# Patient Record
Sex: Male | Born: 1965 | Race: White | Hispanic: No | Marital: Married | Smoking: Never smoker
Health system: Southern US, Community
[De-identification: ages and names within clinical notes are randomized; demographics above are authoritative.]

## PROBLEM LIST (undated history)

## (undated) DIAGNOSIS — E785 Hyperlipidemia, unspecified: Secondary | ICD-10-CM

## (undated) DIAGNOSIS — F419 Anxiety disorder, unspecified: Secondary | ICD-10-CM

## (undated) DIAGNOSIS — K649 Unspecified hemorrhoids: Secondary | ICD-10-CM

## (undated) DIAGNOSIS — J45909 Unspecified asthma, uncomplicated: Secondary | ICD-10-CM

## (undated) DIAGNOSIS — K219 Gastro-esophageal reflux disease without esophagitis: Secondary | ICD-10-CM

## (undated) DIAGNOSIS — D369 Benign neoplasm, unspecified site: Secondary | ICD-10-CM

## (undated) DIAGNOSIS — D649 Anemia, unspecified: Secondary | ICD-10-CM

## (undated) DIAGNOSIS — T7840XA Allergy, unspecified, initial encounter: Secondary | ICD-10-CM

## (undated) HISTORY — DX: Benign neoplasm, unspecified site: D36.9

## (undated) HISTORY — PX: UPPER GASTROINTESTINAL ENDOSCOPY: SHX188

## (undated) HISTORY — PX: APPENDECTOMY: SHX54

## (undated) HISTORY — DX: Unspecified asthma, uncomplicated: J45.909

## (undated) HISTORY — DX: Gastro-esophageal reflux disease without esophagitis: K21.9

## (undated) HISTORY — PX: WISDOM TOOTH EXTRACTION: SHX21

## (undated) HISTORY — DX: Allergy, unspecified, initial encounter: T78.40XA

## (undated) HISTORY — PX: COLONOSCOPY: SHX174

## (undated) HISTORY — DX: Anxiety disorder, unspecified: F41.9

## (undated) HISTORY — DX: Unspecified hemorrhoids: K64.9

## (undated) HISTORY — PX: POLYPECTOMY: SHX149

## (undated) HISTORY — DX: Anemia, unspecified: D64.9

## (undated) HISTORY — DX: Hyperlipidemia, unspecified: E78.5

---

## 1998-03-12 HISTORY — PX: NASAL SEPTUM SURGERY: SHX37

## 1999-11-21 ENCOUNTER — Emergency Department (HOSPITAL_COMMUNITY): Admission: EM | Admit: 1999-11-21 | Discharge: 1999-11-21 | Payer: Self-pay | Admitting: Emergency Medicine

## 1999-11-30 ENCOUNTER — Encounter: Payer: Self-pay | Admitting: Internal Medicine

## 1999-11-30 ENCOUNTER — Encounter: Admission: RE | Admit: 1999-11-30 | Discharge: 1999-11-30 | Payer: Self-pay | Admitting: Internal Medicine

## 2003-09-03 ENCOUNTER — Ambulatory Visit (HOSPITAL_COMMUNITY): Admission: RE | Admit: 2003-09-03 | Discharge: 2003-09-03 | Payer: Self-pay | Admitting: Gastroenterology

## 2005-05-18 ENCOUNTER — Encounter: Admission: RE | Admit: 2005-05-18 | Discharge: 2005-05-18 | Payer: Self-pay | Admitting: Family Medicine

## 2007-09-03 ENCOUNTER — Encounter: Payer: Self-pay | Admitting: Gastroenterology

## 2007-11-19 ENCOUNTER — Encounter: Payer: Self-pay | Admitting: Gastroenterology

## 2007-11-21 ENCOUNTER — Ambulatory Visit (HOSPITAL_COMMUNITY): Admission: RE | Admit: 2007-11-21 | Discharge: 2007-11-21 | Payer: Self-pay | Admitting: Gastroenterology

## 2007-12-05 ENCOUNTER — Encounter: Admission: RE | Admit: 2007-12-05 | Discharge: 2007-12-05 | Payer: Self-pay | Admitting: Gastroenterology

## 2007-12-05 ENCOUNTER — Encounter: Payer: Self-pay | Admitting: Gastroenterology

## 2007-12-29 ENCOUNTER — Encounter: Payer: Self-pay | Admitting: Gastroenterology

## 2007-12-29 DIAGNOSIS — R933 Abnormal findings on diagnostic imaging of other parts of digestive tract: Secondary | ICD-10-CM | POA: Insufficient documentation

## 2008-01-15 ENCOUNTER — Ambulatory Visit (HOSPITAL_COMMUNITY): Admission: RE | Admit: 2008-01-15 | Discharge: 2008-01-15 | Payer: Self-pay | Admitting: Gastroenterology

## 2008-01-15 ENCOUNTER — Encounter: Payer: Self-pay | Admitting: Gastroenterology

## 2008-01-15 ENCOUNTER — Ambulatory Visit: Payer: Self-pay | Admitting: Gastroenterology

## 2010-04-03 ENCOUNTER — Encounter: Payer: Self-pay | Admitting: Gastroenterology

## 2010-07-28 NOTE — Op Note (Signed)
NAME:  Dylan Stewart, Dylan Stewart                      ACCOUNT NO.:  [id number]   MEDICAL RECORD NO.:  [id number]                   PATIENT TYPE:  AMB   LOCATION:  ENDO                                 FACILITY:  [facility]   PHYSICIAN:  Danise Edge, M.D.                DATE OF BIRTH:  [birth year]   DATE OF PROCEDURE:  09/03/2003  DATE OF DISCHARGE:                                 OPERATIVE REPORT   PROCEDURE:  Esophagogastroduodenoscopy and Savary esophageal dilation.   PROCEDURE INDICATION:  Mr. Dylan Stewart is a 45 year old male, born  09-Jun-1965.  Mr. Dylan Stewart has intermittent solid food dysphagia.  A  barium swallow x-ray a few years ago revealed a shallow ring at the  esophagogastric junction.   ENDOSCOPIST:  Danise Edge, M.D.   PREMEDICATION:  1. Fentanyl 100 mcg.  2. Versed 10 mg.   DESCRIPTION OF PROCEDURE:  After obtaining informed consent, Mr. Dylan Stewart  was placed in the left lateral decubitus position.  I administered  intravenous fentanyl and intravenous Versed to achieve conscious sedation  for the procedure.  The patient's blood pressure, oxygen saturation, and  cardiac rhythm were monitored throughout the procedure and documented in the  medical record.   The Olympus gastroscope was passed through the posterior hypopharynx into  the proximal esophagus without difficulty.  The hypopharynx, larynx, and  vocal cords appeared normal.   ESOPHAGOSCOPY:  The proximal and mid and lower segments of the esophageal  mucosa appear completely normal.  Endoscopically, there is no evidence for  the presence of a Schatzki's ring, esophageal mucosal scarring, or  esophageal stricture formation.   GASTROSCOPY:  Retroflexed view of the gastric cardia and fundus was normal.  The gastric body, antrum, and pylorus appear normal.   DUODENOSCOPY:  The duodenal bulb and descending duodenum appear normal.   SAVARY ESOPHAGEAL DILATION:  The Savary dilator wire was passed through  the  endoscope and the tip of the guidewire advanced to the distal gastric  antrum, as confirmed endoscopically without using radiology.  The 15 mm  Savary dilator passed without resistance.  Repeat esophagogastroscopy  revealed no dilation of a ring or stricture at the esophagogastric junction  and no gastric trauma due to the guidewire.   ASSESSMENT:  Normal esophagogastroduodenoscopy.  A 15 mm Savary dilator  passed without resistance and without dilation of a stricture or ring at the  esophagogastric junction.   PLAN:  If Dylan Stewart's dysphagia recurs postesophageal dilation, I will  treat him for probable esophageal spasm.  I cannot rule out achalasia, but I  think it would be unlikely.                                               Danise Edge, M.D.    MJ/MEDQ  D:  09/03/2003  T:  09/03/2003  Job:  [id number]   cc:   Ike Bene, M.D.  [address]  [place]  [place] [address]  Fax: [phone]

## 2011-09-03 ENCOUNTER — Other Ambulatory Visit: Payer: Self-pay | Admitting: Gastroenterology

## 2011-09-03 DIAGNOSIS — M545 Low back pain: Secondary | ICD-10-CM

## 2011-09-10 ENCOUNTER — Other Ambulatory Visit: Payer: Self-pay

## 2012-11-29 ENCOUNTER — Encounter (HOSPITAL_COMMUNITY): Payer: Self-pay | Admitting: Emergency Medicine

## 2012-11-29 ENCOUNTER — Emergency Department (HOSPITAL_COMMUNITY)
Admission: EM | Admit: 2012-11-29 | Discharge: 2012-11-29 | Disposition: A | Discharge status: Home / Self Care | Payer: Managed Care, Other (non HMO) | Attending: Emergency Medicine | Admitting: Emergency Medicine

## 2012-11-29 ENCOUNTER — Emergency Department (HOSPITAL_COMMUNITY): Payer: Managed Care, Other (non HMO)

## 2012-11-29 DIAGNOSIS — Z79899 Other long term (current) drug therapy: Secondary | ICD-10-CM | POA: Insufficient documentation

## 2012-11-29 DIAGNOSIS — M549 Dorsalgia, unspecified: Secondary | ICD-10-CM

## 2012-11-29 DIAGNOSIS — M545 Low back pain, unspecified: Secondary | ICD-10-CM | POA: Insufficient documentation

## 2012-11-29 MED ORDER — HYDROMORPHONE HCL PF 1 MG/ML IJ SOLN
1.0000 mg | Freq: Once | INTRAMUSCULAR | Status: AC
  Administered 2012-11-29: 1 mg via INTRAVENOUS
  Filled 2012-11-29: qty 1

## 2012-11-29 MED ORDER — OXYCODONE-ACETAMINOPHEN 5-325 MG PO TABS: 2.0000 | ORAL_TABLET | ORAL | Status: DC | PRN

## 2012-11-29 MED ORDER — ONDANSETRON HCL 4 MG/2ML IJ SOLN
4.0000 mg | Freq: Once | INTRAMUSCULAR | Status: AC
  Administered 2012-11-29: 4 mg via INTRAVENOUS
  Filled 2012-11-29: qty 2

## 2012-11-29 MED ORDER — DIAZEPAM 5 MG PO TABS: 5.0000 mg | ORAL_TABLET | Freq: Four times a day (QID) | ORAL | Status: DC | PRN

## 2012-11-29 NOTE — ED Provider Notes (Signed)
CSN: [id number]     Arrival date & time 11/29/12  1625 History   First MD Initiated Contact with Patient 11/29/12 1646     Chief Complaint  Patient presents with  . Flank Pain   (Consider location/radiation/quality/duration/timing/severity/associated sxs/prior Treatment) HPI Comments: Patient's presents to the ER with complaints of right flank pain. Patient reports that the pain began 3 weeks ago. At that time it was a slight pain, dull ache. It started after doing some yard work, but he did not notice any specific injury. Patient reports that a week ago the pain significantly worsened. He saw his doctor at that time and it was felt that he might have a herniated disc, was given steroids and Vicodin, but has not had any pain relief. Patient reports that the pain is constant, moderate to severe and at times very severe. He reports that it is positional, as worsens when he sits for a long time, and bruises he is up and mobile. He does not have radiation of the pain. No numbness, weakness in the lower extremities. No change in bowel or bladder function.  Patient is a 47 y.o. male presenting with flank pain.  Flank Pain    History reviewed. No pertinent past medical history. Past Surgical History  Procedure Laterality Date  . Appendectomy    . Wisdom tooth extraction     No family history on file. History  Substance Use Topics  . Smoking status: Never Smoker   . Smokeless tobacco: Not on file  . Alcohol Use: Yes     Comment: socially    Review of Systems  Constitutional: Negative for fever.  Gastrointestinal: Negative.   Genitourinary: Positive for flank pain. Negative for dysuria and hematuria.  Neurological: Negative.   All other systems reviewed and are negative.    Allergies  Morphine and related  Home Medications   Current Outpatient Rx  Name  Route  Sig  Dispense  Refill  . acetaminophen (TYLENOL) 500 MG tablet   Oral   Take 500 mg by mouth every 6 (six) hours as  needed for pain.         . cyclobenzaprine (FLEXERIL) 5 MG tablet   Oral   Take 5 mg by mouth 3 (three) times daily as needed for muscle spasms.         . DULoxetine (CYMBALTA) 60 MG capsule   Oral   Take 60 mg by mouth daily.         Marland Kitchen esomeprazole (NEXIUM) 40 MG capsule   Oral   Take 40 mg by mouth daily before breakfast.         . HYDROcodone-acetaminophen (NORCO/VICODIN) 5-325 MG per tablet   Oral   Take 1 tablet by mouth every 6 (six) hours as needed for pain.          BP 116/72  Pulse 87  Temp(Src) 98.1 F (36.7 C) (Oral)  Resp 20  SpO2 99% Physical Exam  Constitutional: He is oriented to person, place, and time. He appears well-developed and well-nourished. No distress.  HENT:  Head: Normocephalic and atraumatic.  Right Ear: Hearing normal.  Left Ear: Hearing normal.  Nose: Nose normal.  Mouth/Throat: Oropharynx is clear and moist and mucous membranes are normal.  Eyes: Conjunctivae and EOM are normal. Pupils are equal, round, and reactive to light.  Neck: Normal range of motion. Neck supple.  Cardiovascular: Regular rhythm, S1 normal and S2 normal.  Exam reveals no gallop and no friction rub.  No murmur heard. Pulmonary/Chest: Effort normal and breath sounds normal. No respiratory distress. He exhibits no tenderness.  Abdominal: Soft. Normal appearance and bowel sounds are normal. There is no hepatosplenomegaly. There is no tenderness. There is no rebound, no guarding, no tenderness at McBurney's point and negative Murphy's sign. No hernia.  Musculoskeletal: Normal range of motion.       Lumbar back: He exhibits tenderness. He exhibits no bony tenderness.       Back:  Neurological: He is alert and oriented to person, place, and time. He has normal strength. No cranial nerve deficit or sensory deficit. Coordination normal. GCS eye subscore is 4. GCS verbal subscore is 5. GCS motor subscore is 6.  Skin: Skin is warm, dry and intact. No rash noted. No  cyanosis.  Psychiatric: He has a normal mood and affect. His speech is normal and behavior is normal. Thought content normal.    ED Course  Procedures (including critical care time) Labs Review Labs Reviewed - No data to display Imaging Review Ct Abdomen Pelvis Wo Contrast  11/29/2012   CLINICAL DATA:  Flank pain  EXAM: CT ABDOMEN AND PELVIS WITHOUT CONTRAST  TECHNIQUE: Multidetector CT imaging of the abdomen and pelvis was performed following the standard protocol without oral or intravenous contrast.  COMPARISON:  Abdominal MRI December 05, 2007  FINDINGS: There is mild scarring in the posterior right base. Lung bases are otherwise clear.  There is again noted a cystic structure just anterior to the distal esophagus measuring 1.7 x 1.4 cm. 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  No focal liver lesions or appreciable on this noncontrast enhanced study. There is no appreciable biliary duct dilatation. Gallbladder wall is not thickened.  Spleen, pancreas, and adrenals appear normal. Kidneys bilaterally show no mass, calculus, or hydronephrosis on either side. There is no ureteral calculus or ureterectasis on either side.  In the pelvis, there is no mass or fluid collection. There is fat in both inguinal rings. Appendix is absent. There is no periappendiceal region inflammation.  There is no bowel obstruction. No free air or portal venous air.  There is no ascites, adenopathy, or abscess in the abdomen or pelvis. Aorta is non aneurysmal. There are no blastic or lytic bone lesions.  IMPRESSION: There is no bowel obstruction. No abscess. No mesenteric inflammation. Appendix is absent.  No renal or ureteral calculus. No hydronephrosis.  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   Electronically Signed   By: Bretta Bang   On: 11/29/2012 18:45    MDM  Diagnosis: Severe lumbar pain  Patient presents to the ER for  evaluation of severe right flank and back pain. Symptoms do seem to be musculoskeletal in nature based on his presentation. Pain is very positional in nature. CAT scan was performed to rule out kidney stone other pathology. CAT scan shows nothing acute. Patient administered Dilaudid here in the ER. He has achieved improved pain control. Will change his outpatient medication to Percocet, followup with primary doctor Monday.    Gilda Crease, MD 11/29/12 [phone]

## 2012-11-29 NOTE — ED Notes (Signed)
Pt from home c/o L flank pain x3 weeks. Pt reports that he has seen PCP and was tx for possible ruptured disc. Pt denies dysuria, frequent urination, hematuria, N/V/D or fever. Pt adds that he has had 2 syncopal episodes in the past 3 days but none today. Pt is A&O and in NAD

## 2013-10-03 ENCOUNTER — Emergency Department (HOSPITAL_COMMUNITY)
Admission: EM | Admit: 2013-10-03 | Discharge: 2013-10-03 | Disposition: A | Discharge status: Home / Self Care | Payer: 59 | Attending: Emergency Medicine | Admitting: Emergency Medicine

## 2013-10-03 ENCOUNTER — Encounter (HOSPITAL_COMMUNITY): Payer: Self-pay | Admitting: Emergency Medicine

## 2013-10-03 DIAGNOSIS — R55 Syncope and collapse: Secondary | ICD-10-CM | POA: Diagnosis present

## 2013-10-03 DIAGNOSIS — R61 Generalized hyperhidrosis: Secondary | ICD-10-CM | POA: Diagnosis not present

## 2013-10-03 DIAGNOSIS — R51 Headache: Secondary | ICD-10-CM | POA: Insufficient documentation

## 2013-10-03 DIAGNOSIS — Z79899 Other long term (current) drug therapy: Secondary | ICD-10-CM | POA: Diagnosis not present

## 2013-10-03 DIAGNOSIS — R42 Dizziness and giddiness: Secondary | ICD-10-CM | POA: Diagnosis not present

## 2013-10-03 LAB — COMPREHENSIVE METABOLIC PANEL
ALT: 17 U/L (ref 0–53)
AST: 16 U/L (ref 0–37)
Albumin: 3.9 g/dL (ref 3.5–5.2)
Alkaline Phosphatase: 60 U/L (ref 39–117)
Anion gap: 14 (ref 5–15)
BUN: 14 mg/dL (ref 6–23)
CO2: 23 mEq/L (ref 19–32)
Calcium: 9.3 mg/dL (ref 8.4–10.5)
Chloride: 102 mEq/L (ref 96–112)
Creatinine, Ser: 1.07 mg/dL (ref 0.50–1.35)
GFR calc Af Amer: >90 mL/min (ref >90)
GFR calc non Af Amer: 80 mL/min — ABNORMAL LOW (ref >90)
Glucose, Bld: 91 mg/dL (ref 70–99)
Potassium: 4.1 mEq/L (ref 3.7–5.3)
Sodium: 139 mEq/L (ref 137–147)
Total Bilirubin: 0.5 mg/dL (ref 0.3–1.2)
Total Protein: 7.2 g/dL (ref 6.0–8.3)

## 2013-10-03 LAB — I-STAT CHEM 8, ED
BUN: 13 mg/dL (ref 6–23)
Calcium, Ion: 1.16 mmol/L (ref 1.12–1.23)
Chloride: 103 mEq/L (ref 96–112)
Creatinine, Ser: 1.2 mg/dL (ref 0.50–1.35)
Glucose, Bld: 89 mg/dL (ref 70–99)
HCT: 47 % (ref 39.0–52.0)
Hemoglobin: 16 g/dL (ref 13.0–17.0)
Potassium: 3.9 mEq/L (ref 3.7–5.3)
Sodium: 139 mEq/L (ref 137–147)
TCO2: 23 mmol/L (ref 0–100)

## 2013-10-03 LAB — CBC WITH DIFFERENTIAL/PLATELET
Basophils Absolute: 0 10*3/uL (ref 0.0–0.1)
Basophils Relative: 0 % (ref 0–1)
Eosinophils Absolute: 0.3 10*3/uL (ref 0.0–0.7)
Eosinophils Relative: 4 % (ref 0–5)
HCT: 46.3 % (ref 39.0–52.0)
Hemoglobin: 15.8 g/dL (ref 13.0–17.0)
Lymphocytes Relative: 22 % (ref 12–46)
Lymphs Abs: 1.7 10*3/uL (ref 0.7–4.0)
MCH: 31 pg (ref 26.0–34.0)
MCHC: 34.1 g/dL (ref 30.0–36.0)
MCV: 91 fL (ref 78.0–100.0)
Monocytes Absolute: 0.7 10*3/uL (ref 0.1–1.0)
Monocytes Relative: 9 % (ref 3–12)
Neutro Abs: 5.1 10*3/uL (ref 1.7–7.7)
Neutrophils Relative %: 65 % (ref 43–77)
Platelets: 233 10*3/uL (ref 150–400)
RBC: 5.09 MIL/uL (ref 4.22–5.81)
RDW: 12.9 % (ref 11.5–15.5)
WBC: 7.9 10*3/uL (ref 4.0–10.5)

## 2013-10-03 LAB — URINALYSIS, ROUTINE W REFLEX MICROSCOPIC
Bilirubin Urine: NEGATIVE
Glucose, UA: NEGATIVE mg/dL
Hgb urine dipstick: NEGATIVE
Ketones, ur: NEGATIVE mg/dL
Leukocytes, UA: NEGATIVE
Nitrite: NEGATIVE
Protein, ur: NEGATIVE mg/dL
Specific Gravity, Urine: 1.013 (ref 1.005–1.030)
Urobilinogen, UA: 1 mg/dL (ref 0.0–1.0)
pH: 7.5 (ref 5.0–8.0)

## 2013-10-03 LAB — I-STAT TROPONIN, ED: Troponin i, poc: 0.01 ng/mL (ref 0.00–0.08)

## 2013-10-03 MED ORDER — SODIUM CHLORIDE 0.9 % IV BOLUS (SEPSIS)
1000.0000 mL | Freq: Once | INTRAVENOUS | Status: AC
  Administered 2013-10-03: 1000 mL via INTRAVENOUS

## 2013-10-03 NOTE — ED Notes (Signed)
Bed: WA08 Expected date:  Expected time:  Means of arrival:  Comments: ems near syncope

## 2013-10-03 NOTE — ED Notes (Signed)
Woke up this morning with a HA. Was outside doing yard work. Did not eat breakfast. 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 Hasn't been "hydrating like usual."Started to feel lightheadedness 15 minutes after starting. Laid down felt dizziness. Denies SOB and chest pain 108/70 (lying) 82/62 (sitting up) HR 93 12 Lead acutely unremarkable CBG 129. No cardiac hx. Received approx 100 cc NS. 20 PIV left arm. Denies D, V, F, chills.

## 2013-10-03 NOTE — ED Provider Notes (Signed)
CSN: [id number]     Arrival date & time 10/03/13  1152 History   First MD Initiated Contact with Patient 10/03/13 1158     Chief Complaint  Patient presents with  . Near Syncope     (Consider location/radiation/quality/duration/timing/severity/associated sxs/prior Treatment) HPI Comments: The patient is a 48 year old otherwise healthy male who presents to the emergency today after having a near syncopal episode. He reports that he woke up this morning with a mild headache. He went outside and began working in the yard without eating breakfast. He states that he has not been hydrating himself well. He began to feel lightheaded approximately 15 minutes after starting the yard work. He had an episode of diaphoresis and felt as though he was going to pass out. He sat down and this episode resolved spontaneously. He reports that he was already feeling better by the time EMS arrived. He denies any chest pain or shortness of breath. No nausea, vomiting, diarrhea, abdominal pain, fever, chill, cough. The patient currently feels significantly improved.  The history is provided by the patient. No language interpreter was used.    History reviewed. No pertinent past medical history. Past Surgical History  Procedure Laterality Date  . Appendectomy    . Wisdom tooth extraction     History reviewed. No pertinent family history. History  Substance Use Topics  . Smoking status: Never Smoker   . Smokeless tobacco: Not on file  . Alcohol Use: Yes     Comment: socially    Review of Systems  Constitutional: Positive for diaphoresis. Negative for fever and chills.  Respiratory: Negative for shortness of breath.   Cardiovascular: Negative for chest pain.  Gastrointestinal: Negative for nausea, vomiting and abdominal pain.  Neurological: Positive for light-headedness and headaches. Negative for syncope.  All other systems reviewed and are negative.     Allergies  Morphine and related  Home  Medications   Prior to Admission medications   Medication Sig Start Date End Date Taking? Authorizing Provider  acetaminophen (TYLENOL) 500 MG tablet Take 1,000 mg by mouth every 6 (six) hours as needed for pain.    Yes Historical Provider, MD  DULoxetine (CYMBALTA) 60 MG capsule Take 60 mg by mouth daily.   Yes Historical Provider, MD  esomeprazole (NEXIUM) 40 MG capsule Take 40 mg by mouth daily before breakfast.   Yes Historical Provider, MD   BP 108/69  Pulse 83  Temp(Src) 98.2 F (36.8 C) (Oral)  Resp 14  SpO2 95% Physical Exam  Nursing note and vitals reviewed. Constitutional: He is oriented to person, place, and time. He appears well-developed and well-nourished. He does not appear ill. No distress.  Well appearing  HENT:  Head: Normocephalic and atraumatic.  Right Ear: External ear normal.  Left Ear: External ear normal.  Nose: Nose normal.  Mouth/Throat: Uvula is midline and oropharynx is clear and moist. Mucous membranes are dry (mild).  Eyes: Conjunctivae and EOM are normal. Pupils are equal, round, and reactive to light.  Neck: Normal range of motion. No tracheal deviation present.  No nuchal rigidity or meningeal signs  Cardiovascular: Normal rate, regular rhythm, normal heart sounds, intact distal pulses and normal pulses.   Pulses:      Radial pulses are 2+ on the right side, and 2+ on the left side.       Posterior tibial pulses are 2+ on the right side, and 2+ on the left side.  Pulmonary/Chest: Effort normal and breath sounds normal. No stridor.  Abdominal:  Soft. He exhibits no distension. There is no tenderness.  Musculoskeletal: Normal range of motion.  Moves all extremities without guarding or ataxia.   Neurological: He is alert and oriented to person, place, and time. He has normal strength. No sensory deficit. Coordination normal. GCS eye subscore is 4. GCS verbal subscore is 5. GCS motor subscore is 6.  Grip strength 5/5. Strength 5/5 in all extremities.    Skin: Skin is warm and dry. He is not diaphoretic.  Psychiatric: He has a normal mood and affect. His behavior is normal.    ED Course  Procedures (including critical care time) Labs Review Labs Reviewed  COMPREHENSIVE METABOLIC PANEL - Abnormal; Notable for the following:    GFR calc non Af Amer 80 (*)    All other components within normal limits  CBC WITH DIFFERENTIAL  URINALYSIS, ROUTINE W REFLEX MICROSCOPIC  I-STAT CHEM 8, ED  I-STAT TROPOININ, ED    Imaging Review No results found.   EKG Interpretation   Date/Time:  Saturday October 03 2013 12:00:47 EDT Ventricular Rate:  85 PR Interval:  141 QRS Duration: 73 QT Interval:  346 QTC Calculation: 411 R Axis:   33 Text Interpretation:  Sinus rhythm Non-specific ST-t changes No old  tracing to compare Confirmed by [place]  MD, [place] ([id number]) on 10/03/2013  2:48:00 PM      MDM   Final diagnoses:  Near syncope    Patient presents to the emergency department after a near-syncopal episode. He was orthostatic for EMS. It appears his near syncope was related to dehydration. No associated chest pain or shortness of breath. Labs are unremarkable. Patient was hydrated in the emergency department. He feels significantly improved without any complaints prior to discharge. Discussed reasons to return to the emergency department immediately. Vital signs stable for discharge. Discussed case with Dr. Wilson Singer who agrees with plan. Patient / Family / Caregiver informed of clinical course, understand medical decision-making process, and agree with plan.    Elwyn Lade, PA-C 10/04/13 [phone]

## 2013-10-03 NOTE — Discharge Instructions (Signed)
Near-Syncope Near-syncope (commonly known as near fainting) is sudden weakness, dizziness, or feeling like you might pass out. During an episode of near-syncope, you may also develop pale skin, have tunnel vision, or feel sick to your stomach (nauseous). Near-syncope may occur when getting up after sitting or while standing for a long time. It is caused by a sudden decrease in blood flow to the brain. This decrease can result from various causes or triggers, most of which are not serious. However, because near-syncope can sometimes be a sign of something serious, a medical evaluation is required. The specific cause is often not determined. HOME CARE INSTRUCTIONS  Monitor your condition for any changes. The following actions may help to alleviate any discomfort you are experiencing:  Have someone stay with you until you feel stable.  Lie down right away and prop your feet up if you start feeling like you might faint. Breathe deeply and steadily. Wait until all the symptoms have passed. Most of these episodes last only a few minutes. You may feel tired for several hours.   Drink enough fluids to keep your urine clear or pale yellow.   If you are taking blood pressure or heart medicine, get up slowly when seated or lying down. Take several minutes to sit and then stand. This can reduce dizziness.  Follow up with your health care provider as directed. SEEK IMMEDIATE MEDICAL CARE IF:   You have a severe headache.   You have unusual pain in the chest, abdomen, or back.   You are bleeding from the mouth or rectum, or you have black or tarry stool.   You have an irregular or very fast heartbeat.   You have repeated fainting or have seizure-like jerking during an episode.   You faint when sitting or lying down.   You have confusion.   You have difficulty walking.   You have severe weakness.   You have vision problems.  MAKE SURE YOU:   Understand these instructions.  Will  watch your condition.  Will get help right away if you are not doing well or get worse. Document Released: 02/26/2005 Document Revised: 03/03/2013 Document Reviewed: 08/01/2012 [employer] Patient Information 2015 [employer]. This information is not intended to replace advice given to you by your health care provider. Make sure you discuss any questions you have with your health care provider.  

## 2013-10-03 NOTE — ED Notes (Signed)
Pt aware urine sample is needed 

## 2013-10-07 NOTE — ED Provider Notes (Signed)
Medical screening examination/treatment/procedure(s) were performed by non-physician practitioner and as supervising physician I was immediately available for consultation/collaboration.   EKG Interpretation   Date/Time:  Saturday October 03 2013 12:00:47 EDT Ventricular Rate:  85 PR Interval:  141 QRS Duration: 73 QT Interval:  346 QTC Calculation: 411 R Axis:   33 Text Interpretation:  Sinus rhythm Non-specific ST-t changes No old  tracing to compare Confirmed by Wilson Singer  MD, [place] ([id number]) on 10/03/2013  2:48:00 PM       Virgel Manifold, MD 10/07/13 1344

## 2013-11-19 ENCOUNTER — Institutional Professional Consult (permissible substitution): Payer: Self-pay | Admitting: Neurology

## 2013-11-19 ENCOUNTER — Telehealth: Payer: Self-pay | Admitting: *Deleted

## 2013-11-19 NOTE — Telephone Encounter (Signed)
Patient was a no show for today's appointment(11/19/13-9:30)

## 2013-11-20 ENCOUNTER — Encounter: Payer: Self-pay | Admitting: Neurology

## 2013-11-20 ENCOUNTER — Ambulatory Visit (INDEPENDENT_AMBULATORY_CARE_PROVIDER_SITE_OTHER): Payer: 59 | Admitting: Neurology

## 2013-11-20 ENCOUNTER — Encounter (INDEPENDENT_AMBULATORY_CARE_PROVIDER_SITE_OTHER): Payer: Self-pay

## 2013-11-20 VITALS — BP 107/70 | HR 74 | Temp 97.4°F | Ht 74.0 in | Wt 209.5 lb

## 2013-11-20 DIAGNOSIS — R0609 Other forms of dyspnea: Secondary | ICD-10-CM

## 2013-11-20 DIAGNOSIS — R51 Headache: Secondary | ICD-10-CM

## 2013-11-20 DIAGNOSIS — G478 Other sleep disorders: Secondary | ICD-10-CM

## 2013-11-20 DIAGNOSIS — R0683 Snoring: Secondary | ICD-10-CM

## 2013-11-20 DIAGNOSIS — R519 Headache, unspecified: Secondary | ICD-10-CM

## 2013-11-20 DIAGNOSIS — R400 Somnolence: Secondary | ICD-10-CM

## 2013-11-20 DIAGNOSIS — G4710 Hypersomnia, unspecified: Secondary | ICD-10-CM

## 2013-11-20 DIAGNOSIS — R0989 Other specified symptoms and signs involving the circulatory and respiratory systems: Secondary | ICD-10-CM

## 2013-11-20 NOTE — Progress Notes (Signed)
Subjective:    Patient ID: Dylan Stewart is a 48 y.o. male.  HPI    Star Age, MD, PhD [facility] [address] [place], [place] [address]  Dear Dr. Joylene Draft,   I saw your patient, Dylan Stewart, upon your kind request in my neurologic clinic today for initial consultation of his sleep disorder, in particular, concern for underlying obstructive sleep apnea. The patient is unaccompanied today. As you know, [name] is a 48 year old right-handed man with an underlying medical history of reflux disease, hyperlipidemia, low back pain, allergic rhinitis and syncope with a recent emergency room visit on 10/03/2013 with near-syncope, who reports daytime somnolence, morning HAs and snoring.  He works in [employer], [place].   His typical bedtime is reported to be around 11:30 PM and usual wake time is around 7 AM. Sleep onset typically occurs within 10-15 minutes. He reports feeling poorly rested upon awakening. He wakes up on an average 2 to 3 times in the middle of the night and has to go to the bathroom 0 to 1 times on a typical night. He admits to occasional morning headaches.  He reports excessive daytime somnolence (EDS) and His Epworth Sleepiness Score (ESS) is 16/24 today. He has not fallen asleep while driving recently. The patient has not been taking a planned nap, but may take a nap on the WE and feels refreshed after a nap.  He has been known to snore for the past few years. Snoring is reportedly mild to loud, but not overtly associated with choking sounds or witnessed apneas. The patient denies a sense of choking or strangling feeling. There is no report of nighttime reflux, with no nighttime cough experienced. The patient has not noted any RLS symptoms and is not known to kick while asleep or before falling asleep. There is no family history of RLS or OSA.    He denies cataplexy, sleep paralysis, hypnagogic or hypnopompic hallucinations, or  sleep attacks. He does not report any vivid dreams, nightmares, dream enactments, or parasomnias, such as sleep talking or sleep walking. The patient has not had a sleep study or a home sleep test.  He consumes many caffeinated beverages per day, usually in the form of sweat tea.  His bedroom is usually dark and cool. There is TV in the bedroom and usually it is not on at night.   His Past Medical History Is Significant For: Past Medical History  Diagnosis Date  . Anxiety     His Past Surgical History Is Significant For: Past Surgical History  Procedure Laterality Date  . Appendectomy    . Wisdom tooth extraction      His Family History Is Significant For: Family History  Problem Relation Age of Onset  . Pancreatic cancer Mother   . Diabetes Paternal Grandfather     His Social History Is Significant For: History   Social History  . Marital Status: Married    Spouse Name: [name]    Number of Children: 0  . Years of Education: BS   Occupational History  .      [employer]   Social History Main Topics  . Smoking status: Never Smoker   . Smokeless tobacco: Never Used  . Alcohol Use: Yes     Comment: socially  . Drug Use: No  . Sexual Activity: None   Other Topics Concern  . None   Social History Narrative   Patient lives at home with spouse.  Caffeine Use: 40 oz daily    His Allergies Are:  Allergies  Allergen Reactions  . Morphine And Related Nausea Only  :   His Current Medications Are:  Outpatient Encounter Prescriptions as of 11/20/2013  Medication Sig  . acetaminophen (TYLENOL) 500 MG tablet Take 1,000 mg by mouth every 6 (six) hours as needed for pain.   . DULoxetine (CYMBALTA) 60 MG capsule Take 60 mg by mouth daily.  Marland Kitchen esomeprazole (NEXIUM) 40 MG capsule Take 40 mg by mouth daily before breakfast.  :  Review of Systems:  Out of a complete 14 point review of systems, all are reviewed and negative with the exception of these symptoms as listed below:    Review of Systems  Constitutional: Positive for fatigue.       Weight gain  HENT: Positive for trouble swallowing.   Respiratory: Positive for cough.        Snoring  Skin:       itching  Allergic/Immunologic: Positive for environmental allergies.    Objective:  Neurologic Exam  Physical Exam Physical Examination:   Filed Vitals:   11/20/13 1104  BP: 107/70  Pulse: 74  Temp: 97.4 F (36.3 C)    General Examination: The patient is a very pleasant 48 y.o. male in no acute distress. He appears well-developed and well-nourished and well groomed.   HEENT: Normocephalic, atraumatic, pupils are equal, round and reactive to light and accommodation. Funduscopic exam is normal with sharp disc margins noted. Extraocular tracking is good without limitation to gaze excursion or nystagmus noted. Normal smooth pursuit is noted. Hearing is grossly intact. Tympanic membranes are clear bilaterally. Face is symmetric with normal facial animation and normal facial sensation. Speech is clear with no dysarthria noted. There is no hypophonia. There is no lip, neck/head, jaw or voice tremor. Neck is supple with full range of passive and active motion. There are no carotid bruits on auscultation. Oropharynx exam reveals: mild mouth dryness, good dental hygiene and moderate airway crowding, due to tonsils in place which are cryptic in appearance and 2+ in size bilaterally, somewhat redundant soft palate with mildly enlarged uvula. Mallampati is class II. Tongue protrudes centrally and palate elevates symmetrically. Neck size is 16 5/8 inches. He has a very mild overbite. Nasal inspection reveals no significant nasal mucosal bogginess or redness and no septal deviation.   Chest: Clear to auscultation without wheezing, rhonchi or crackles noted.  Heart: S1+S2+0, regular and normal without murmurs, rubs or gallops noted.   Abdomen: Soft, non-tender and non-distended with normal bowel sounds appreciated on  auscultation.  Extremities: There is no pitting edema in the distal lower extremities bilaterally. Pedal pulses are intact.  Skin: Warm and dry without trophic changes noted. There are no varicose veins.  Musculoskeletal: exam reveals no obvious joint deformities, tenderness or joint swelling or erythema.   Neurologically:  Mental status: The patient is awake, alert and oriented in all 4 spheres. His immediate and remote memory, attention, language skills and fund of knowledge are appropriate. There is no evidence of aphasia, agnosia, apraxia or anomia. Speech is clear with normal prosody and enunciation. Thought process is linear. Mood is normal and affect is normal.  Cranial nerves II - XII are as described above under HEENT exam. In addition: shoulder shrug is normal with equal shoulder height noted. Motor exam: Normal bulk, strength and tone is noted. There is no drift, tremor or rebound. Romberg is negative. Reflexes are 2+ throughout. Babinski: Toes are flexor bilaterally.  Fine motor skills and coordination: intact with normal finger taps, normal hand movements, normal rapid alternating patting, normal foot taps and normal foot agility.  Cerebellar testing: No dysmetria or intention tremor on finger to nose testing. Heel to shin is unremarkable bilaterally. There is no truncal or gait ataxia.  Sensory exam: intact to light touch, pinprick, vibration, temperature sense in the upper and lower extremities.  Gait, station and balance: He stands easily. No veering to one side is noted. No leaning to one side is noted. Posture is age-appropriate and stance is narrow based. Gait shows normal stride length and normal pace. No problems turning are noted. He turns en bloc. Tandem walk is unremarkable. Intact toe and heel stance is noted.               Assessment and Plan:   In summary, OSEAS DETTY is a very pleasant 48 y.o.-year old male with an underlying medical history of reflux disease,  hyperlipidemia, low back pain, allergic rhinitis and syncope with a recent emergency room visit on 10/03/2013 with near-syncope, who reports daytime somnolence, morning HAs and snoring. His history and physical exam are concerning for underlying obstructive sleep apnea (OSA). I had a long chat with the patient about his symptoms, my findings and the diagnosis of OSA, its prognosis and treatment options. We talked about medical treatments, surgical interventions and non-pharmacological approaches. I explained in particular the risks and ramifications of untreated moderate to severe OSA, especially with respect to developing cardiovascular disease down the Road, including congestive heart failure, difficult to treat hypertension, cardiac arrhythmias, or stroke. Even type 2 diabetes has, in part, been linked to untreated OSA. Symptoms of untreated OSA include daytime sleepiness, memory problems, mood irritability and mood disorder such as depression and anxiety, lack of energy, as well as recurrent headaches, especially morning headaches. We talked about trying to maintain a healthy lifestyle in general, as well as the importance of weight control. I encouraged the patient to eat healthy, exercise daily and keep well hydrated, to keep a scheduled bedtime and wake time routine, to not skip any meals and eat healthy snacks in between meals. I advised the patient not to drive when feeling sleepy.he is also advised to reduce his caffeine intake and avoid caffeine in the late afternoon and evenings.   I recommended the following at this time: sleep study with potential positive airway pressure titration.  I explained the sleep test procedure to the patient and also outlined possible surgical and non-surgical treatment options of OSA, including the use of a custom-made dental device (which would require a referral to a specialist dentist or oral surgeon), upper airway surgical options, such as pillar implants,  radiofrequency surgery, tongue base surgery, and UPPP (which would involve a referral to an ENT surgeon). Rarely, jaw surgery such as mandibular advancement may be considered.  I also explained the CPAP treatment option to the patient, who indicated that he would be willing to try CPAP if the need arises. I explained the importance of being compliant with PAP treatment, not only for insurance purposes but primarily to improve His symptoms, and for the patient's long term health benefit, including to reduce His cardiovascular risks. I answered all his questions today and the patient was in agreement. I would like to see him back after the sleep study is completed and encouraged him to call with any interim questions, concerns, problems or updates.   Thank you very much for allowing me to participate in the  care of this nice patient. If I can be of any further assistance to you please do not hesitate to call me at [phone].  Sincerely,   Star Age, MD, PhD

## 2013-11-20 NOTE — Patient Instructions (Signed)

## 2013-12-02 ENCOUNTER — Other Ambulatory Visit: Payer: Self-pay | Admitting: Neurology

## 2013-12-02 DIAGNOSIS — R0683 Snoring: Secondary | ICD-10-CM

## 2013-12-02 DIAGNOSIS — G478 Other sleep disorders: Secondary | ICD-10-CM

## 2013-12-02 DIAGNOSIS — R400 Somnolence: Secondary | ICD-10-CM

## 2013-12-02 DIAGNOSIS — R51 Headache: Secondary | ICD-10-CM

## 2013-12-02 DIAGNOSIS — R519 Headache, unspecified: Secondary | ICD-10-CM

## 2013-12-02 NOTE — Progress Notes (Signed)
Home sleep test request entered as insurance denied his sleep study.

## 2013-12-11 ENCOUNTER — Encounter (INDEPENDENT_AMBULATORY_CARE_PROVIDER_SITE_OTHER): Payer: Self-pay | Admitting: *Deleted

## 2013-12-11 DIAGNOSIS — R0683 Snoring: Secondary | ICD-10-CM

## 2013-12-11 DIAGNOSIS — R519 Headache, unspecified: Secondary | ICD-10-CM

## 2013-12-11 DIAGNOSIS — G478 Other sleep disorders: Secondary | ICD-10-CM

## 2013-12-11 DIAGNOSIS — Z0289 Encounter for other administrative examinations: Secondary | ICD-10-CM

## 2013-12-11 DIAGNOSIS — R400 Somnolence: Secondary | ICD-10-CM

## 2013-12-11 DIAGNOSIS — R51 Headache: Secondary | ICD-10-CM

## 2013-12-14 DIAGNOSIS — R0683 Snoring: Secondary | ICD-10-CM

## 2013-12-25 ENCOUNTER — Telehealth: Payer: Self-pay | Admitting: Neurology

## 2013-12-25 NOTE — Telephone Encounter (Signed)
Please call and notify the patient that the recent sleep study did not show any significant obstructive sleep apnea. Overall mild snoring was noted. Please inform patient that I would like to go over the details of the study during a follow up appointment and if not already previously scheduled, arrange a followup appointment (please utilize a followu-up slot). Also, route or fax report to PCP and referring MD, if other than PCP.  Once you have spoken to patient, you can close this encounter.   Thanks,  Star Age, MD, PhD [facility])

## 2013-12-28 NOTE — Telephone Encounter (Signed)
Called and left a message informing the patient that Dr. Rexene Alberts had requested a follow up visit to go over the results of his home sleep study.  Patient was requested to call our office to schedule that appointment.  A copy of the results was faxed to Dr. Crist Infante, and a copy was mailed to the patient.

## 2015-06-08 ENCOUNTER — Encounter: Payer: Self-pay | Admitting: Cardiology

## 2015-06-08 ENCOUNTER — Ambulatory Visit (INDEPENDENT_AMBULATORY_CARE_PROVIDER_SITE_OTHER): Payer: 59 | Admitting: Cardiology

## 2015-06-08 VITALS — BP 110/80 | HR 84 | Ht 74.0 in | Wt 213.2 lb

## 2015-06-08 DIAGNOSIS — R55 Syncope and collapse: Secondary | ICD-10-CM

## 2015-06-08 NOTE — Patient Instructions (Signed)
Medication Instructions:  Your physician recommends that you continue on your current medications as directed. Please refer to the Current Medication list given to you today.  Labwork: None ordered  Testing/Procedures: None ordered  Follow-Up: To be determined once Dr. Curt Bears has reviewed medical records.  Any Other Special Instructions Will Be Listed Below (If Applicable). Dr. Curt Bears will call you once he has received and reviewed your medical records.  If you need a refill on your cardiac medications before your next appointment, please call your pharmacy.  Thank you for choosing [facility]!!   Trinidad Curet, RN [phone]

## 2015-06-08 NOTE — Progress Notes (Signed)
Electrophysiology Office Note   Date:  06/08/2015   ID:  Dylan Stewart, Dylan Stewart [birth year], MRN [id number]  PCP:  Jerlyn Ly, MD  Cardiologist:  Constance Haw, MD    Chief Complaint  Patient presents with  . Loss of Consciousness     History of Present Illness: Dylan Stewart is a 50 y.o. male who presents today for electrophysiology evaluation.   He presents after an episode of syncope.  It occurred in [place].  He broke his nose.  He had an MRI and Ct scan which were both without acute abnormality.  He has had 3 episodes in the last 8 years.  When he was in [facility], he was sitting on a bar stool, listening to a friend talking about his kid having seizures. He says that he got very nauseous and sweaty and lightheaded. He got up and walked to the hall and passed out at that time. The next thing he remembers he was in the emergency room. He fractured his nose, and required stitches in his chin. He says that in the past he has had issues with seeing blood in becoming flushed and nauseous. 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 He was unable to get the chainsaw started when sat down and had the same sort of feeling.  Today, he denies symptoms of palpitations, chest pain, shortness of breath, orthopnea, PND, lower extremity edema, claudication, dizziness, presyncope, syncope, bleeding, or neurologic sequela. The patient is tolerating medications without difficulties and is otherwise without complaint today.    Past Medical History  Diagnosis Date  . Anxiety    Past Surgical History  Procedure Laterality Date  . Appendectomy    . Wisdom tooth extraction       Current Outpatient Prescriptions  Medication Sig Dispense Refill  . acetaminophen (TYLENOL) 500 MG tablet Take 1,000 mg by mouth every 6 (six) hours as needed for pain.     Marland Kitchen atorvastatin (LIPITOR) 20 MG tablet Take 20 mg by mouth daily.  3  . DULoxetine  (CYMBALTA) 60 MG capsule Take 60 mg by mouth daily.    Marland Kitchen esomeprazole (NEXIUM) 40 MG capsule Take 40 mg by mouth daily before breakfast.     No current facility-administered medications for this visit.    Allergies:   Morphine and related   Social History:  The patient  reports that he has never smoked. He has never used smokeless tobacco. He reports that he drinks alcohol. He reports that he does not use illicit drugs.   Family History:  The patient's family history includes Diabetes in his father and paternal grandfather; Pancreatic cancer in his mother.    ROS:  Please see the history of present illness.   Otherwise, review of systems is positive for syncope.   All other systems are reviewed and negative.    PHYSICAL EXAM: VS:  BP 110/80 mmHg  Pulse 84  Ht 6\' 2"  (1.88 m)  Wt 213 lb 3.2 oz (96.707 kg)  BMI 27.36 kg/m2 , BMI Body mass index is 27.36 kg/(m^2). GEN: Well nourished, well developed, in no acute distress HEENT: normal Neck: no JVD, carotid bruits, or masses Cardiac: RRR; no murmurs, rubs, or gallops,no edema  Respiratory:  clear to auscultation bilaterally, normal work of breathing GI: soft, nontender, nondistended, + BS MS: no deformity or atrophy Skin: warm and dry Neuro:  Strength and sensation are intact Psych: euthymic mood, full  affect  EKG:  EKG is ordered today. The ekg ordered today shows sinus rhythm, rate 84, nonspecific T wave changes   Recent Labs: No results found for requested labs within last 365 days.    Lipid Panel  No results found for: CHOL, TRIG, HDL, CHOLHDL, VLDL, LDLCALC, LDLDIRECT   Wt Readings from Last 3 Encounters:  06/08/15 213 lb 3.2 oz (96.707 kg)  11/20/13 209 lb 8 oz (95.029 kg)   ASSESSMENT AND PLAN:  1.  Syncope: At this time it is unclear the cause of syncope, although his description of the episode in [facility] sounds like a vasovagal episode. He has no palpitations and no shortness of breath that would lead me to  think that he is having some sort of arrhythmia or heart failure to cause his syncope. Unfortunately, we don't have the results of his [facility] hospitalization yet, and we are waiting on those results. Would like to review his EKGs at the time to see if there is any evidence of any arrhythmia. I have told him that we could get an echocardiogram or an event monitor, but he has declined both of those. We will call him back after reviewing the [facility] records.    Current medicines are reviewed at length with the patient today.   The patient does not have concerns regarding his medicines.  The following changes were made today:  none  Labs/ tests ordered today include:  Orders Placed This Encounter  Procedures  . EKG 12-Lead     Disposition:   FU with Will Camnitz pending record review  Signed, Will Meredith Leeds, MD  06/08/2015 2:42 PM     [place] [phone] (office) [phone] (fax)

## 2015-07-05 ENCOUNTER — Telehealth: Payer: Self-pay | Admitting: Cardiology

## 2015-07-05 NOTE — Telephone Encounter (Signed)
LVM for patient to call me back. Need to get ROI to him somehow so I can get his records from [facility] for Dr. Curt Bears.

## 2015-07-15 ENCOUNTER — Telehealth: Payer: Self-pay | Admitting: *Deleted

## 2015-07-15 NOTE — Telephone Encounter (Signed)
lmtcb to inform him that Dr. Curt Bears reviewed [facility] records and what his findings are.

## 2015-07-22 NOTE — Telephone Encounter (Signed)
Left a detailed message on the pts confirmed VM, that both Dr Curt Bears and Venida Jarvis RN are both out of the office today.  Left a message on the pts VM that they will be back next week, so I will route this message to both of them for further review and follow-up with the pt, based on Dr Curt Bears receiving his records from [facility] and what his findings are.  Left a message for the pt to call back in regards to this VM left.

## 2015-07-22 NOTE — Telephone Encounter (Signed)
Follow up      Returning a call to the nurse.  [place].

## 2015-07-25 NOTE — Telephone Encounter (Signed)
Informed patient nothing concerning from [facility] records. He will follow up with [place] PRN.

## 2015-11-16 ENCOUNTER — Ambulatory Visit (INDEPENDENT_AMBULATORY_CARE_PROVIDER_SITE_OTHER): Payer: BLUE CROSS/BLUE SHIELD | Admitting: Emergency Medicine

## 2015-11-16 ENCOUNTER — Ambulatory Visit (INDEPENDENT_AMBULATORY_CARE_PROVIDER_SITE_OTHER)
Admission: RE | Admit: 2015-11-16 | Discharge: 2015-11-16 | Disposition: A | Discharge status: Home / Self Care | Payer: BLUE CROSS/BLUE SHIELD | Source: Ambulatory Visit | Attending: Emergency Medicine | Admitting: Emergency Medicine

## 2015-11-16 ENCOUNTER — Encounter: Payer: Self-pay | Admitting: Emergency Medicine

## 2015-11-16 VITALS — BP 102/68 | HR 99 | Ht 74.0 in | Wt 217.0 lb

## 2015-11-16 DIAGNOSIS — R053 Chronic cough: Secondary | ICD-10-CM

## 2015-11-16 DIAGNOSIS — J309 Allergic rhinitis, unspecified: Secondary | ICD-10-CM | POA: Diagnosis not present

## 2015-11-16 DIAGNOSIS — R05 Cough: Secondary | ICD-10-CM

## 2015-11-16 DIAGNOSIS — K219 Gastro-esophageal reflux disease without esophagitis: Secondary | ICD-10-CM | POA: Diagnosis not present

## 2015-11-16 MED ORDER — HYDROCODONE-HOMATROPINE 5-1.5 MG/5ML PO SYRP: 5.0000 mL | ORAL_SOLUTION | Freq: Four times a day (QID) | ORAL | 0 refills | Status: DC | PRN

## 2015-11-16 NOTE — Assessment & Plan Note (Signed)
Add a nasal steroid to his daily Allegra. Have him take these every day until next visit

## 2015-11-16 NOTE — Addendum Note (Signed)
Addended by: Desmond Dike C on: 11/16/2015 04:12 PM   Modules accepted: Orders

## 2015-11-16 NOTE — Assessment & Plan Note (Signed)
Temporarily increase the Nexium to 40 mg twice a day. Then go back to once a day.

## 2015-11-16 NOTE — Assessment & Plan Note (Signed)
Suspect that this is being sustained by his GERD and allergic rhinitis. I do believe need to treat both of these more aggressively, practice voice rest and cough suppression to see if we can ease his upper airway irritation. At the same, believe we need to evaluate him for possible reactive airways disease. He will have pulmonary function testing. If his cough continues after the effort above then we will perform bronchoscopy to do an anatomical evaluation.

## 2015-11-16 NOTE — Progress Notes (Signed)
Subjective:    Patient ID: Dylan Stewart, male    DOB: 24-Oct-1965, 50 y.o.   MRN: [id number]  HPI 50 year old never smoker with a history of GERD, allergic rhinitis. He has been dealing with a chronic persistent cough for approximately 5-6 weeks. It seemed to start after a meal and has been more bothersome every since. In retrospect he has had some intermittent cough since Spring '17. Typically non-productive.   He has been on therapy for reflux all the way through the cough. He takes allegra qd and has done so through the entire time his cough has been flaring. He underwent allergy testing, was allergic to several things, never started immunotherapy.  He was started empirically on Asmanex as a trial to see if he would benefit, took it for about 3 weeks. He has also been treated with a prednisone taper and levaquin. He did not really improve on either.   Has a family hx of pancreatic CA, no hx of lung CA  Review of Systems  Constitutional: Negative for fever and unexpected weight change.  HENT: Negative for congestion, dental problem, ear pain, nosebleeds, postnasal drip, rhinorrhea, sinus pressure, sneezing, sore throat and trouble swallowing.   Eyes: Negative for redness and itching.  Respiratory: Positive for cough. Negative for chest tightness, shortness of breath and wheezing.   Cardiovascular: Negative for palpitations and leg swelling.  Gastrointestinal: Negative for nausea and vomiting.  Genitourinary: Negative for dysuria.  Musculoskeletal: Negative for joint swelling.  Skin: Negative for rash.  Neurological: Negative for headaches.  Hematological: Does not bruise/bleed easily.  Psychiatric/Behavioral: Negative for dysphoric mood. The patient is not nervous/anxious.    Past Medical History:  Diagnosis Date  . Anxiety      Family History  Problem Relation Age of Onset  . Pancreatic cancer Mother   . Diabetes Paternal Grandfather   . Diabetes Father      Social  History   Social History  . Marital status: Married    Spouse name: [name]  . Number of children: 0  . Years of education: BS   Occupational History  .  [place] History Main Topics  . Smoking status: Never Smoker  . Smokeless tobacco: Never Used  . Alcohol use Yes     Comment: socially  . Drug use: No  . Sexual activity: Not on file   Other Topics Concern  . Not on file   Social History Narrative   Patient lives at home with spouse.   Caffeine Use: 40 oz daily     Allergies  Allergen Reactions  . Morphine And Related Nausea Only     Outpatient Medications Prior to Visit  Medication Sig Dispense Refill  . acetaminophen (TYLENOL) 500 MG tablet Take 1,000 mg by mouth every 6 (six) hours as needed for pain.     Marland Kitchen atorvastatin (LIPITOR) 20 MG tablet Take 20 mg by mouth daily.  3  . DULoxetine (CYMBALTA) 60 MG capsule Take 60 mg by mouth daily.    Marland Kitchen esomeprazole (NEXIUM) 40 MG capsule Take 40 mg by mouth daily before breakfast.     No facility-administered medications prior to visit.         Objective:   Physical Exam Vitals:   11/16/15 1537  BP: 102/68  Pulse: 99  SpO2: 97%  Weight: 217 lb (98.4 kg)  Height: 6\' 2"  (1.88 m)   Gen: Pleasant, well-nourished, in no distress,  normal  affect  ENT: No lesions,  mouth clear,  oropharynx clear, no postnasal drip  Neck: No JVD, no TMG, no carotid bruits  Lungs: No use of accessory muscles, clear without rales or rhonchi  Cardiovascular: RRR, heart sounds normal, no murmur or gallops, no peripheral edema  Musculoskeletal: No deformities, no cyanosis or clubbing  Neuro: alert, non focal  Skin: Warm, no lesions or rashes     Assessment & Plan:  Chronic cough Suspect that this is being sustained by his GERD and allergic rhinitis. I do believe need to treat both of these more aggressively, practice voice rest and cough suppression to see if we can ease his upper airway irritation.  At the same, believe we need to evaluate him for possible reactive airways disease. He will have pulmonary function testing. If his cough continues after the effort above then we will perform bronchoscopy to do an anatomical evaluation.  Allergic rhinitis Add a nasal steroid to his daily Allegra. Have him take these every day until next visit  GERD (gastroesophageal reflux disease) Temporarily increase the Nexium to 40 mg twice a day. Then go back to once a day.   Baltazar Apo, MD, PhD 11/16/2015, 4:11 PM [facility] [phone] or if no answer [phone]

## 2015-11-16 NOTE — Patient Instructions (Signed)
CXR today We will arrange for full pulmonary function testing Please temporarily increase your nexium to 40mg  twice a day for 2 weeks, then go back to daily Please continue your allegra every day Start fluticasone nasal spray, 2 sprays each nostril daily until your next visit here.  Practice voice rest, cough suppression with hycodan 5cc up to every 6 hours if needed.  Follow with Dr Lamonte Sakai next available to discuss your symptoms and to decide whether we should perform a bronchoscopy.

## 2015-11-23 ENCOUNTER — Inpatient Hospital Stay (HOSPITAL_COMMUNITY): Admission: RE | Admit: 2015-11-23 | Payer: BLUE CROSS/BLUE SHIELD | Source: Ambulatory Visit

## 2015-11-25 ENCOUNTER — Ambulatory Visit (HOSPITAL_COMMUNITY)
Admission: RE | Admit: 2015-11-25 | Discharge: 2015-11-25 | Disposition: A | Discharge status: Home / Self Care | Payer: BLUE CROSS/BLUE SHIELD | Source: Ambulatory Visit | Attending: Emergency Medicine | Admitting: Emergency Medicine

## 2015-11-25 DIAGNOSIS — R053 Chronic cough: Secondary | ICD-10-CM

## 2015-11-25 DIAGNOSIS — R05 Cough: Secondary | ICD-10-CM | POA: Insufficient documentation

## 2015-11-25 LAB — PULMONARY FUNCTION TEST
DL/VA % pred: 85 %
DL/VA: 4.14 ml/min/mmHg/L
DLCO unc % pred: 87 %
DLCO unc: 32.99 ml/min/mmHg
FEF 25-75 Post: 5.32 L/sec
FEF 25-75 Pre: 5.34 L/sec
FEF2575-%Change-Post: 0 %
FEF2575-%Pred-Post: 139 %
FEF2575-%Pred-Pre: 139 %
FEV1-%Change-Post: 3 %
FEV1-%Pred-Post: 109 %
FEV1-%Pred-Pre: 106 %
FEV1-Post: 4.87 L
FEV1-Pre: 4.72 L
FEV1FVC-%Change-Post: 3 %
FEV1FVC-%Pred-Pre: 103 %
FEV6-%Change-Post: 0 %
FEV6-%Pred-Post: 104 %
FEV6-%Pred-Pre: 103 %
FEV6-Post: 5.77 L
FEV6-Pre: 5.75 L
FEV6FVC-%Change-Post: 0 %
FEV6FVC-%Pred-Post: 101 %
FEV6FVC-%Pred-Pre: 101 %
FVC-%Change-Post: 0 %
FVC-%Pred-Post: 101 %
FVC-%Pred-Pre: 101 %
FVC-Post: 5.84 L
FVC-Pre: 5.83 L
Post FEV1/FVC ratio: 83 %
Post FEV6/FVC ratio: 99 %
Pre FEV1/FVC ratio: 81 %
Pre FEV6/FVC Ratio: 99 %
RV % pred: 125 %
RV: 2.85 L
TLC % pred: 112 %
TLC: 8.72 L

## 2015-11-25 MED ORDER — ALBUTEROL SULFATE (2.5 MG/3ML) 0.083% IN NEBU
2.5000 mg | INHALATION_SOLUTION | Freq: Once | RESPIRATORY_TRACT | Status: AC
Start: 2015-11-25 — End: 2015-11-25
  Administered 2015-11-25: 2.5 mg via RESPIRATORY_TRACT

## 2015-12-29 ENCOUNTER — Ambulatory Visit: Payer: BLUE CROSS/BLUE SHIELD | Admitting: Emergency Medicine

## 2016-01-13 DIAGNOSIS — D2239 Melanocytic nevi of other parts of face: Secondary | ICD-10-CM | POA: Diagnosis not present

## 2016-01-13 DIAGNOSIS — D2339 Other benign neoplasm of skin of other parts of face: Secondary | ICD-10-CM | POA: Diagnosis not present

## 2016-01-17 ENCOUNTER — Telehealth: Payer: Self-pay | Admitting: Emergency Medicine

## 2016-01-17 NOTE — Telephone Encounter (Signed)
[facility] is booked all 3 of those days. We will need to let patient know and send to [facility] and [facility] to see if they are willing to double book.

## 2016-01-18 NOTE — Telephone Encounter (Signed)
lmtcb x2 for pt. Pt can be scheduled on [facility]'s next available >> 02/10/16.

## 2016-01-19 NOTE — Telephone Encounter (Signed)
lmtcb x3 for pt. Per triage protocol, message will be closed.  

## 2016-01-20 ENCOUNTER — Ambulatory Visit: Payer: BLUE CROSS/BLUE SHIELD | Admitting: Emergency Medicine

## 2016-01-31 DIAGNOSIS — Z0000 Encounter for general adult medical examination without abnormal findings: Secondary | ICD-10-CM | POA: Diagnosis not present

## 2016-01-31 DIAGNOSIS — Z125 Encounter for screening for malignant neoplasm of prostate: Secondary | ICD-10-CM | POA: Diagnosis not present

## 2016-01-31 DIAGNOSIS — E784 Other hyperlipidemia: Secondary | ICD-10-CM | POA: Diagnosis not present

## 2016-02-01 ENCOUNTER — Encounter: Payer: Self-pay | Admitting: Emergency Medicine

## 2016-02-01 ENCOUNTER — Ambulatory Visit (INDEPENDENT_AMBULATORY_CARE_PROVIDER_SITE_OTHER): Payer: BLUE CROSS/BLUE SHIELD | Admitting: Emergency Medicine

## 2016-02-01 DIAGNOSIS — R053 Chronic cough: Secondary | ICD-10-CM

## 2016-02-01 DIAGNOSIS — K219 Gastro-esophageal reflux disease without esophagitis: Secondary | ICD-10-CM

## 2016-02-01 DIAGNOSIS — J301 Allergic rhinitis due to pollen: Secondary | ICD-10-CM

## 2016-02-01 DIAGNOSIS — R05 Cough: Secondary | ICD-10-CM | POA: Diagnosis not present

## 2016-02-01 DIAGNOSIS — Z23 Encounter for immunization: Secondary | ICD-10-CM

## 2016-02-01 NOTE — Patient Instructions (Addendum)
Your breathing texting shows normal lung function.  Please continue your nexium 40mg  daily Please continue your allegra daily Plan to start fluticasone NS at the end of February through the Spring and then at the beginning of Fall.  Please call our office for flares in your cough. We will want to see you at these times to troubleshoot.

## 2016-02-01 NOTE — Progress Notes (Signed)
Subjective:    Patient ID: Dylan Stewart, male    DOB: 05-12-1965, 50 y.o.   MRN: [id number]  HPI 50 year old never smoker with a history of GERD, allergic rhinitis. He has been dealing with a chronic persistent cough for approximately 5-6 weeks. It seemed to start after a meal and has been more bothersome every since. In retrospect he has had some intermittent cough since Spring '17. Typically non-productive.   He has been on therapy for reflux all the way through the cough. He takes allegra qd and has done so through the entire time his cough has been flaring. He underwent allergy testing, was allergic to several things, never started immunotherapy.  He was started empirically on Asmanex as a trial to see if he would benefit, took it for about 3 weeks. He has also been treated with a prednisone taper and levaquin. He did not really improve on either.   Has a family hx of pancreatic CA, no hx of lung CA  ROV 02/01/16 -- pt follows up today for chronic cough. felt that rhinitis and GERD were contributing. Last time increased nexium temporarily (2 weeks), added fluticasone NS to allegra. He tells me that he has been doing better > cough has calmed down a lot.  He used flonase for a while, not on it currently.  Also performed full PFT 11/25/15 that I have reviewed, show normal AF, no BD response, normal volumes and DLCO.   Review of Systems  Constitutional: Negative for fever and unexpected weight change.  HENT: Negative for congestion, dental problem, ear pain, nosebleeds, postnasal drip, rhinorrhea, sinus pressure, sneezing, sore throat and trouble swallowing.   Eyes: Negative for redness and itching.  Respiratory: Positive for cough. Negative for chest tightness, shortness of breath and wheezing.   Cardiovascular: Negative for palpitations and leg swelling.  Gastrointestinal: Negative for nausea and vomiting.  Genitourinary: Negative for dysuria.  Musculoskeletal: Negative for joint  swelling.  Skin: Negative for rash.  Neurological: Negative for headaches.  Hematological: Does not bruise/bleed easily.  Psychiatric/Behavioral: Negative for dysphoric mood. The patient is not nervous/anxious.    Past Medical History:  Diagnosis Date  . Anxiety      Family History  Problem Relation Age of Onset  . Pancreatic cancer Mother   . Diabetes Paternal Grandfather   . Diabetes Father      Social History   Social History  . Marital status: Married    Spouse name: [name]  . Number of children: 0  . Years of education: BS   Occupational History  .  [place] History Main Topics  . Smoking status: Never Smoker  . Smokeless tobacco: Never Used  . Alcohol use Yes     Comment: socially  . Drug use: No  . Sexual activity: Not on file   Other Topics Concern  . Not on file   Social History Narrative   Patient lives at home with spouse.   Caffeine Use: 40 oz daily     Allergies  Allergen Reactions  . Morphine And Related Nausea Only     Outpatient Medications Prior to Visit  Medication Sig Dispense Refill  . acetaminophen (TYLENOL) 500 MG tablet Take 1,000 mg by mouth every 6 (six) hours as needed for pain.     Marland Kitchen atorvastatin (LIPITOR) 20 MG tablet Take 20 mg by mouth daily.  3  . DULoxetine (CYMBALTA) 60 MG capsule Take 60 mg by  mouth daily.    Marland Kitchen esomeprazole (NEXIUM) 40 MG capsule Take 40 mg by mouth daily before breakfast.    . HYDROcodone-homatropine (HYCODAN) 5-1.5 MG/5ML syrup Take 5 mLs by mouth every 6 (six) hours as needed for cough. 180 mL 0   No facility-administered medications prior to visit.         Objective:   Physical Exam Vitals:   02/01/16 1128  BP: 110/76  Pulse: 94  SpO2: 97%  Weight: 216 lb (98 kg)  Height: 6\' 2"  (1.88 m)   Gen: Pleasant, well-nourished, in no distress,  normal affect  ENT: No lesions,  mouth clear,  oropharynx clear, no postnasal drip  Neck: No JVD, no TMG, no carotid  bruits  Lungs: No use of accessory muscles, clear without rales or rhonchi  Cardiovascular: RRR, heart sounds normal, no murmur or gallops, no peripheral edema  Musculoskeletal: No deformities, no cyanosis or clubbing  Neuro: alert, non focal  Skin: Warm, no lesions or rashes     Assessment & Plan:  Chronic cough With contributions of GERD and rhinitis. PFT show normal airflows. Will treat GERD year-round., add / subtract rhinitis therapy depending on the season.   GERD (gastroesophageal reflux disease) nexium 40 qd,. discussed with him possibly increasing short term to bid for times when he is flaring or having more cough  Allergic rhinitis Continue allegra. Restart nasal steroid before each spring and fall season   Baltazar Apo, MD, PhD 02/01/2016, 1:01 PM [facility] [phone] or if no answer [phone]

## 2016-02-01 NOTE — Assessment & Plan Note (Signed)
With contributions of GERD and rhinitis. PFT show normal airflows. Will treat GERD year-round., add / subtract rhinitis therapy depending on the season.

## 2016-02-01 NOTE — Assessment & Plan Note (Signed)
nexium 40 qd,. discussed with him possibly increasing short term to bid for times when he is flaring or having more cough

## 2016-02-01 NOTE — Assessment & Plan Note (Signed)
Continue allegra. Restart nasal steroid before each spring and fall season

## 2016-02-09 DIAGNOSIS — R400 Somnolence: Secondary | ICD-10-CM | POA: Diagnosis not present

## 2016-02-09 DIAGNOSIS — E784 Other hyperlipidemia: Secondary | ICD-10-CM | POA: Diagnosis not present

## 2016-02-09 DIAGNOSIS — Z1389 Encounter for screening for other disorder: Secondary | ICD-10-CM | POA: Diagnosis not present

## 2016-02-09 DIAGNOSIS — Z6828 Body mass index (BMI) 28.0-28.9, adult: Secondary | ICD-10-CM | POA: Diagnosis not present

## 2016-02-09 DIAGNOSIS — Z23 Encounter for immunization: Secondary | ICD-10-CM | POA: Diagnosis not present

## 2016-02-09 DIAGNOSIS — M545 Low back pain: Secondary | ICD-10-CM | POA: Diagnosis not present

## 2016-02-09 DIAGNOSIS — Z0000 Encounter for general adult medical examination without abnormal findings: Secondary | ICD-10-CM | POA: Diagnosis not present

## 2016-02-09 DIAGNOSIS — R55 Syncope and collapse: Secondary | ICD-10-CM | POA: Diagnosis not present

## 2016-02-10 ENCOUNTER — Encounter: Payer: Self-pay | Admitting: Gastroenterology

## 2016-02-28 DIAGNOSIS — Z1212 Encounter for screening for malignant neoplasm of rectum: Secondary | ICD-10-CM | POA: Diagnosis not present

## 2016-04-10 ENCOUNTER — Ambulatory Visit (AMBULATORY_SURGERY_CENTER): Payer: Self-pay

## 2016-04-10 VITALS — Ht 74.0 in | Wt 218.6 lb

## 2016-04-10 DIAGNOSIS — Z1211 Encounter for screening for malignant neoplasm of colon: Secondary | ICD-10-CM

## 2016-04-10 MED ORDER — NA SULFATE-K SULFATE-MG SULF 17.5-3.13-1.6 GM/177ML PO SOLN: ORAL | 0 refills | Status: DC

## 2016-04-10 NOTE — Progress Notes (Signed)
Per pt, no allergies to soy or egg products.Pt not taking any weight loss meds or using  O2 at home. 

## 2016-04-16 ENCOUNTER — Encounter: Payer: Self-pay | Admitting: Gastroenterology

## 2016-04-16 ENCOUNTER — Encounter: Payer: BLUE CROSS/BLUE SHIELD | Admitting: Gastroenterology

## 2016-04-27 ENCOUNTER — Ambulatory Visit (AMBULATORY_SURGERY_CENTER): Payer: BLUE CROSS/BLUE SHIELD | Admitting: Gastroenterology

## 2016-04-27 ENCOUNTER — Encounter: Payer: Self-pay | Admitting: Gastroenterology

## 2016-04-27 VITALS — BP 109/78 | HR 80 | Temp 97.8°F | Resp 12 | Ht 74.0 in | Wt 218.0 lb

## 2016-04-27 DIAGNOSIS — D122 Benign neoplasm of ascending colon: Secondary | ICD-10-CM

## 2016-04-27 DIAGNOSIS — K635 Polyp of colon: Secondary | ICD-10-CM

## 2016-04-27 DIAGNOSIS — Z1211 Encounter for screening for malignant neoplasm of colon: Secondary | ICD-10-CM

## 2016-04-27 DIAGNOSIS — Z1212 Encounter for screening for malignant neoplasm of rectum: Secondary | ICD-10-CM

## 2016-04-27 DIAGNOSIS — D120 Benign neoplasm of cecum: Secondary | ICD-10-CM

## 2016-04-27 DIAGNOSIS — D123 Benign neoplasm of transverse colon: Secondary | ICD-10-CM

## 2016-04-27 MED ORDER — SODIUM CHLORIDE 0.9 % IV SOLN: 500.0000 mL | INTRAVENOUS | Status: DC

## 2016-04-27 NOTE — Op Note (Signed)
[facility] Patient Name: Dylan Stewart Procedure Date: 04/27/2016 8:35 AM MRN: [id number] Endoscopist: Remo Lipps P. Armbruster MD, MD Age: 51 Referring MD:  Date of Birth: 1965/06/02 Gender: Male Account #: [id number] Procedure:                Colonoscopy Indications:              Screening for colorectal malignant neoplasm, This                            is the patient's first colonoscopy Medicines:                Monitored Anesthesia Care Procedure:                Pre-Anesthesia Assessment:                           - Prior to the procedure, a History and Physical                            was performed, and patient medications and                            allergies were reviewed. The patient's tolerance of                            previous anesthesia was also reviewed. The risks                            and benefits of the procedure and the sedation                            options and risks were discussed with the patient.                            All questions were answered, and informed consent                            was obtained. Prior Anticoagulants: The patient has                            taken no previous anticoagulant or antiplatelet                            agents. ASA Grade Assessment: II - A patient with                            mild systemic disease. After reviewing the risks                            and benefits, the patient was deemed in                            satisfactory condition to undergo the procedure.  After obtaining informed consent, the colonoscope                            was passed under direct vision. Throughout the                            procedure, the patient's blood pressure, pulse, and                            oxygen saturations were monitored continuously. The                            Model CF-HQ190L [id number]) scope was introduced                            through the  anus and advanced to the the cecum,                            identified by appendiceal orifice and ileocecal                            valve. The colonoscopy was performed without                            difficulty. The patient tolerated the procedure                            well. The quality of the bowel preparation was                            adequate. The ileocecal valve, appendiceal orifice,                            and rectum were photographed. Scope In: 8:41:28 AM Scope Out: 9:07:16 AM Scope Withdrawal Time: 0 hours 21 minutes 22 seconds  Total Procedure Duration: 0 hours 25 minutes 48 seconds  Findings:                 The perianal and digital rectal examinations were                            normal.                           A 4 mm polyp was found in the cecum. The polyp was                            sessile. The polyp was removed with a cold snare.                            Resection and retrieval were complete.                           Six sessile polyps were found in the ascending  colon. The polyps were 3 to 8 mm in size. These                            polyps were removed with a cold snare. Resection                            and retrieval were complete.                           A 5 mm polyp was found in the hepatic flexure. The                            polyp was sessile. The polyp was removed with a                            cold snare. Resection and retrieval were complete.                           A 4 mm polyp was found in the transverse colon. The                            polyp was sessile. The polyp was removed with a                            cold snare. Resection and retrieval were complete.                           Internal hemorrhoids were found. The hemorrhoids                            were moderate.                           The bowel preparation was only fair during                            insertion and  several minutes were spent lavaging                            the colon to achieve adequate views. The exam was                            otherwise without abnormality. Complications:            No immediate complications. Estimated blood loss:                            Minimal. Estimated Blood Loss:     Estimated blood loss was minimal. Impression:               - One 4 mm polyp in the cecum, removed with a cold                            snare. Resected  and retrieved.                           - Six 3 to 8 mm polyps in the ascending colon,                            removed with a cold snare. Resected and retrieved.                           - One 5 mm polyp at the hepatic flexure, removed                            with a cold snare. Resected and retrieved.                           - One 4 mm polyp in the transverse colon, removed                            with a cold snare. Resected and retrieved.                           - Internal hemorrhoids.                           - Residual stool in the colon, severa minutes spent                            lavaging the colon to achieve adequate views.                           - The examination was otherwise normal. Recommendation:           - Patient has a contact number available for                            emergencies. The signs and symptoms of potential                            delayed complications were discussed with the                            patient. Return to normal activities tomorrow.                            Written discharge instructions were provided to the                            patient.                           - Resume previous diet.                           - Continue present medications.                           -  No ibuprofen, naproxen, or other non-steroidal                            anti-inflammatory drugs for 2 weeks after polyp                            removal.                           -  Await pathology results.                           - Repeat colonoscopy is recommended for                            surveillance. The colonoscopy date will be                            determined after pathology results from today's                            exam become available for review. Remo Lipps P. Armbruster MD, MD 04/27/2016 9:12:07 AM This report has been signed electronically.

## 2016-04-27 NOTE — Patient Instructions (Signed)
Impression/Recommendations:  Polyp handout given to patient. Hemorrhoid handout given to patient.  No Iburpofen, naproxen, or other NSAID drugs for 2 weeks.   Tylenol only until May 12, 2016.  Repeat colonoscopy recommended for surveillance.   Date to be determined after pathology results reviewed.  YOU HAD AN ENDOSCOPIC PROCEDURE TODAY AT [facility]:   Refer to the procedure report that was given to you for any specific questions about what was found during the examination.  If the procedure report does not answer your questions, please call your gastroenterologist to clarify.  If you requested that your care partner not be given the details of your procedure findings, then the procedure report has been included in a sealed envelope for you to review at your convenience later.  YOU SHOULD EXPECT: Some feelings of bloating in the abdomen. Passage of more gas than usual.  Walking can help get rid of the air that was put into your GI tract during the procedure and reduce the bloating. If you had a lower endoscopy (such as a colonoscopy or flexible sigmoidoscopy) you may notice spotting of blood in your stool or on the toilet paper. If you underwent a bowel prep for your procedure, you may not have a normal bowel movement for a few days.  Please Note:  You might notice some irritation and congestion in your nose or some drainage.  This is from the oxygen used during your procedure.  There is no need for concern and it should clear up in a day or so.  SYMPTOMS TO REPORT IMMEDIATELY:   Following lower endoscopy (colonoscopy or flexible sigmoidoscopy):  Excessive amounts of blood in the stool  Significant tenderness or worsening of abdominal pains  Swelling of the abdomen that is new, acute  Fever of 100F or higher  For urgent or emergent issues, a gastroenterologist can be reached at any hour by calling [phone].   DIET:  We do recommend a small meal at first, but then  you may proceed to your regular diet.  Drink plenty of fluids but you should avoid alcoholic beverages for 24 hours.  ACTIVITY:  You should plan to take it easy for the rest of today and you should NOT DRIVE or use heavy machinery until tomorrow (because of the sedation medicines used during the test).    FOLLOW UP: Our staff will call the number listed on your records the next business day following your procedure to check on you and address any questions or concerns that you may have regarding the information given to you following your procedure. If we do not reach you, we will leave a message.  However, if you are feeling well and you are not experiencing any problems, there is no need to return our call.  We will assume that you have returned to your regular daily activities without incident.  If any biopsies were taken you will be contacted by phone or by letter within the next 1-3 weeks.  Please call us at [phone] if you have not heard about the biopsies in 3 weeks.    SIGNATURES/CONFIDENTIALITY: You and/or your care partner have signed paperwork which will be entered into your electronic medical record.  These signatures attest to the fact that that the information above on your After Visit Summary has been reviewed and is understood.  Full responsibility of the confidentiality of this discharge information lies with you and/or your care-partner.

## 2016-04-27 NOTE — Progress Notes (Signed)
A/ox3 pleased with MAC, report to Jane RN 

## 2016-04-27 NOTE — Progress Notes (Signed)
Called to room to assist during endoscopic procedure.  Patient ID and intended procedure confirmed with present staff. Received instructions for my participation in the procedure from the performing physician.  

## 2016-04-30 ENCOUNTER — Telehealth: Payer: Self-pay | Admitting: *Deleted

## 2016-04-30 NOTE — Telephone Encounter (Signed)
  Follow up Call-  Call back number 04/27/2016  Post procedure Call Back phone  # [phone]  Permission to leave phone message Yes  Some recent data might be hidden     Patient questions:  Do you have a fever, pain , or abdominal swelling? No. Pain Score  0 *  Have you tolerated food without any problems? Yes.    Have you been able to return to your normal activities? Yes.    Do you have any questions about your discharge instructions: Diet   No. Medications  No. Follow up visit  No.  Do you have questions or concerns about your Care? No.  Actions: * If pain score is 4 or above: No action needed, pain <4.

## 2016-05-02 ENCOUNTER — Encounter: Payer: Self-pay | Admitting: Gastroenterology

## 2017-04-25 ENCOUNTER — Other Ambulatory Visit: Payer: Self-pay

## 2017-04-25 ENCOUNTER — Encounter (HOSPITAL_COMMUNITY): Payer: Self-pay | Admitting: Emergency Medicine

## 2017-04-25 ENCOUNTER — Inpatient Hospital Stay (HOSPITAL_COMMUNITY)
Admission: EM | Admit: 2017-04-25 | Discharge: 2017-04-29 | DRG: 101 | Disposition: A | Discharge status: Home / Self Care | Payer: BLUE CROSS/BLUE SHIELD | Attending: Internal Medicine | Admitting: Internal Medicine

## 2017-04-25 DIAGNOSIS — Z833 Family history of diabetes mellitus: Secondary | ICD-10-CM | POA: Diagnosis not present

## 2017-04-25 DIAGNOSIS — Z885 Allergy status to narcotic agent status: Secondary | ICD-10-CM

## 2017-04-25 DIAGNOSIS — R55 Syncope and collapse: Secondary | ICD-10-CM | POA: Diagnosis not present

## 2017-04-25 DIAGNOSIS — G40909 Epilepsy, unspecified, not intractable, without status epilepticus: Principal | ICD-10-CM | POA: Diagnosis present

## 2017-04-25 DIAGNOSIS — E785 Hyperlipidemia, unspecified: Secondary | ICD-10-CM | POA: Diagnosis not present

## 2017-04-25 DIAGNOSIS — Z6828 Body mass index (BMI) 28.0-28.9, adult: Secondary | ICD-10-CM | POA: Diagnosis not present

## 2017-04-25 DIAGNOSIS — Z800 Family history of malignant neoplasm of digestive organs: Secondary | ICD-10-CM | POA: Diagnosis not present

## 2017-04-25 DIAGNOSIS — Z125 Encounter for screening for malignant neoplasm of prostate: Secondary | ICD-10-CM | POA: Diagnosis not present

## 2017-04-25 DIAGNOSIS — Z0000 Encounter for general adult medical examination without abnormal findings: Secondary | ICD-10-CM | POA: Diagnosis not present

## 2017-04-25 DIAGNOSIS — R001 Bradycardia, unspecified: Secondary | ICD-10-CM | POA: Diagnosis not present

## 2017-04-25 DIAGNOSIS — D72829 Elevated white blood cell count, unspecified: Secondary | ICD-10-CM | POA: Diagnosis not present

## 2017-04-25 DIAGNOSIS — I455 Other specified heart block: Secondary | ICD-10-CM | POA: Diagnosis not present

## 2017-04-25 DIAGNOSIS — K219 Gastro-esophageal reflux disease without esophagitis: Secondary | ICD-10-CM | POA: Diagnosis present

## 2017-04-25 DIAGNOSIS — E876 Hypokalemia: Secondary | ICD-10-CM | POA: Diagnosis not present

## 2017-04-25 DIAGNOSIS — F419 Anxiety disorder, unspecified: Secondary | ICD-10-CM | POA: Diagnosis present

## 2017-04-25 DIAGNOSIS — G522 Disorders of vagus nerve: Secondary | ICD-10-CM | POA: Diagnosis not present

## 2017-04-25 DIAGNOSIS — Z79899 Other long term (current) drug therapy: Secondary | ICD-10-CM

## 2017-04-25 DIAGNOSIS — E7849 Other hyperlipidemia: Secondary | ICD-10-CM | POA: Diagnosis not present

## 2017-04-25 DIAGNOSIS — G40209 Localization-related (focal) (partial) symptomatic epilepsy and epileptic syndromes with complex partial seizures, not intractable, without status epilepticus: Secondary | ICD-10-CM | POA: Diagnosis not present

## 2017-04-25 DIAGNOSIS — R569 Unspecified convulsions: Secondary | ICD-10-CM | POA: Diagnosis not present

## 2017-04-25 DIAGNOSIS — I495 Sick sinus syndrome: Secondary | ICD-10-CM | POA: Diagnosis not present

## 2017-04-25 LAB — BASIC METABOLIC PANEL
Anion gap: 12 (ref 5–15)
BUN: 12 mg/dL (ref 6–20)
CO2: 25 mmol/L (ref 22–32)
Calcium: 9.3 mg/dL (ref 8.9–10.3)
Chloride: 96 mmol/L — ABNORMAL LOW (ref 101–111)
Creatinine, Ser: 1 mg/dL (ref 0.61–1.24)
GFR calc Af Amer: >60 mL/min (ref >60)
GFR calc non Af Amer: >60 mL/min (ref >60)
Glucose, Bld: 108 mg/dL — ABNORMAL HIGH (ref 65–99)
Potassium: 3.4 mmol/L — ABNORMAL LOW (ref 3.5–5.1)
Sodium: 133 mmol/L — ABNORMAL LOW (ref 135–145)

## 2017-04-25 LAB — CBC
HCT: 44.9 % (ref 39.0–52.0)
Hemoglobin: 15.5 g/dL (ref 13.0–17.0)
MCH: 31.7 pg (ref 26.0–34.0)
MCHC: 34.5 g/dL (ref 30.0–36.0)
MCV: 91.8 fL (ref 78.0–100.0)
Platelets: 283 10*3/uL (ref 150–400)
RBC: 4.89 MIL/uL (ref 4.22–5.81)
RDW: 13.2 % (ref 11.5–15.5)
WBC: 12.4 10*3/uL — ABNORMAL HIGH (ref 4.0–10.5)

## 2017-04-25 LAB — CBG MONITORING, ED: Glucose-Capillary: 115 mg/dL — ABNORMAL HIGH (ref 65–99)

## 2017-04-25 LAB — I-STAT TROPONIN, ED: Troponin i, poc: 0 ng/mL (ref 0.00–0.08)

## 2017-04-25 NOTE — ED Triage Notes (Signed)
Pt states he has had several sycopal episodes in the past 24 hrs  Pt states he had one last night and three this evening  Pt states he feels fine then starts feeling funny and passes out  Pt states he was seen by his routine dr today for a routine physical and blood work  Pt states he had an EKG in the office along with blood work and multiple blood pressure checks  States at that time he had only had the one episode last night  States the other 3 were today at 1845, 2000, and 2115

## 2017-04-25 NOTE — ED Notes (Signed)
Pt was on the monitor and his heart beat went down to the 20s and pt had syncopal episode

## 2017-04-25 NOTE — ED Notes (Signed)
Pt denies CP, SOB at time of event or currently. He stated he feels dizzy and diaphoretic at the time of the syncopal episode.

## 2017-04-26 ENCOUNTER — Encounter (HOSPITAL_COMMUNITY)
Admission: EM | Disposition: A | Discharge status: Home / Self Care | Payer: Self-pay | Source: Home / Self Care | Attending: Cardiovascular Disease

## 2017-04-26 ENCOUNTER — Inpatient Hospital Stay (HOSPITAL_COMMUNITY): Payer: BLUE CROSS/BLUE SHIELD

## 2017-04-26 DIAGNOSIS — G40209 Localization-related (focal) (partial) symptomatic epilepsy and epileptic syndromes with complex partial seizures, not intractable, without status epilepticus: Secondary | ICD-10-CM | POA: Diagnosis not present

## 2017-04-26 DIAGNOSIS — G522 Disorders of vagus nerve: Secondary | ICD-10-CM | POA: Diagnosis present

## 2017-04-26 DIAGNOSIS — R55 Syncope and collapse: Secondary | ICD-10-CM | POA: Diagnosis present

## 2017-04-26 DIAGNOSIS — D72829 Elevated white blood cell count, unspecified: Secondary | ICD-10-CM | POA: Diagnosis present

## 2017-04-26 DIAGNOSIS — I495 Sick sinus syndrome: Secondary | ICD-10-CM | POA: Diagnosis not present

## 2017-04-26 DIAGNOSIS — E785 Hyperlipidemia, unspecified: Secondary | ICD-10-CM | POA: Diagnosis present

## 2017-04-26 DIAGNOSIS — K219 Gastro-esophageal reflux disease without esophagitis: Secondary | ICD-10-CM | POA: Diagnosis present

## 2017-04-26 DIAGNOSIS — R569 Unspecified convulsions: Secondary | ICD-10-CM | POA: Diagnosis not present

## 2017-04-26 DIAGNOSIS — E876 Hypokalemia: Secondary | ICD-10-CM | POA: Diagnosis present

## 2017-04-26 DIAGNOSIS — Z79899 Other long term (current) drug therapy: Secondary | ICD-10-CM | POA: Diagnosis not present

## 2017-04-26 DIAGNOSIS — F419 Anxiety disorder, unspecified: Secondary | ICD-10-CM | POA: Diagnosis present

## 2017-04-26 DIAGNOSIS — Z800 Family history of malignant neoplasm of digestive organs: Secondary | ICD-10-CM | POA: Diagnosis not present

## 2017-04-26 DIAGNOSIS — Z833 Family history of diabetes mellitus: Secondary | ICD-10-CM | POA: Diagnosis not present

## 2017-04-26 DIAGNOSIS — R001 Bradycardia, unspecified: Secondary | ICD-10-CM

## 2017-04-26 DIAGNOSIS — Z885 Allergy status to narcotic agent status: Secondary | ICD-10-CM | POA: Diagnosis not present

## 2017-04-26 DIAGNOSIS — I455 Other specified heart block: Secondary | ICD-10-CM | POA: Diagnosis present

## 2017-04-26 DIAGNOSIS — G40909 Epilepsy, unspecified, not intractable, without status epilepticus: Secondary | ICD-10-CM | POA: Diagnosis present

## 2017-04-26 LAB — ECHOCARDIOGRAM COMPLETE
Ao-asc: 32 cm
E decel time: 194 msec
E/e' ratio: 9.22
FS: 36 % (ref 28–44)
Height: 74 in
IVS/LV PW RATIO, ED: 1.06
LA ID, A-P, ES: 37 mm
LA diam end sys: 37 mm
LA diam index: 1.61 cm/m2
LA vol A4C: 49.5 ml
LA vol index: 21.3 mL/m2
LA vol: 49.1 mL
LV E/e' medial: 9.22
LV E/e'average: 9.22
LV PW d: 9.4 mm — AB (ref 0.6–1.1)
LV e' LATERAL: 10.1 cm/s
LVOT area: 3.46 cm2
LVOT diameter: 21 mm
Lateral S' vel: 12.1 cm/s
MV Dec: 194
MV Peak grad: 3 mmHg
MV pk A vel: 67.9 m/s
MV pk E vel: 93.1 m/s
TAPSE: 20.3 mm
TDI e' lateral: 10.1
TDI e' medial: 8.27
Weight: 3523.83 oz

## 2017-04-26 LAB — URINALYSIS, ROUTINE W REFLEX MICROSCOPIC
Bilirubin Urine: NEGATIVE
Glucose, UA: NEGATIVE mg/dL
Hgb urine dipstick: NEGATIVE
Ketones, ur: NEGATIVE mg/dL
Leukocytes, UA: NEGATIVE
Nitrite: NEGATIVE
Protein, ur: NEGATIVE mg/dL
Specific Gravity, Urine: 1.019 (ref 1.005–1.030)
pH: 6 (ref 5.0–8.0)

## 2017-04-26 LAB — SURGICAL PCR SCREEN
MRSA, PCR: NEGATIVE
Staphylococcus aureus: NEGATIVE

## 2017-04-26 LAB — TSH: TSH: 1.327 u[IU]/mL (ref 0.350–4.500)

## 2017-04-26 LAB — HIV ANTIBODY (ROUTINE TESTING W REFLEX): HIV Screen 4th Generation wRfx: NONREACTIVE

## 2017-04-26 LAB — MRSA PCR SCREENING: MRSA by PCR: NEGATIVE

## 2017-04-26 SURGERY — PACEMAKER IMPLANT

## 2017-04-26 MED ORDER — ONDANSETRON HCL 4 MG/2ML IJ SOLN: 4.0000 mg | Freq: Four times a day (QID) | INTRAMUSCULAR | Status: DC | PRN

## 2017-04-26 MED ORDER — SODIUM CHLORIDE 0.9 % IR SOLN
Status: AC
  Filled 2017-04-26: qty 2

## 2017-04-26 MED ORDER — PANTOPRAZOLE SODIUM 40 MG PO TBEC
40.0000 mg | DELAYED_RELEASE_TABLET | Freq: Every day | ORAL | Status: DC
  Administered 2017-04-26 – 2017-04-29 (×4): 40 mg via ORAL
  Filled 2017-04-26 (×4): qty 1

## 2017-04-26 MED ORDER — SODIUM CHLORIDE 0.9 % IV SOLN: INTRAVENOUS | Status: DC

## 2017-04-26 MED ORDER — ACETAMINOPHEN 325 MG PO TABS: 650.0000 mg | ORAL_TABLET | ORAL | Status: DC | PRN

## 2017-04-26 MED ORDER — CHLORHEXIDINE GLUCONATE 4 % EX LIQD
60.0000 mL | Freq: Once | CUTANEOUS | Status: DC
  Filled 2017-04-26: qty 60

## 2017-04-26 MED ORDER — SODIUM CHLORIDE 0.9 % IR SOLN
80.0000 mg | Status: DC
  Filled 2017-04-26: qty 2

## 2017-04-26 MED ORDER — DULOXETINE HCL 60 MG PO CPEP
60.0000 mg | ORAL_CAPSULE | Freq: Every day | ORAL | Status: DC
  Administered 2017-04-26 – 2017-04-29 (×4): 60 mg via ORAL
  Filled 2017-04-26 (×4): qty 1

## 2017-04-26 MED ORDER — CEFAZOLIN SODIUM-DEXTROSE 2-4 GM/100ML-% IV SOLN
INTRAVENOUS | Status: AC
  Filled 2017-04-26: qty 100

## 2017-04-26 MED ORDER — ATORVASTATIN CALCIUM 20 MG PO TABS
20.0000 mg | ORAL_TABLET | Freq: Every day | ORAL | Status: DC
  Administered 2017-04-27 – 2017-04-29 (×3): 20 mg via ORAL
  Filled 2017-04-26 (×4): qty 1

## 2017-04-26 MED ORDER — SODIUM CHLORIDE 0.9 % IV BOLUS (SEPSIS)
1000.0000 mL | Freq: Once | INTRAVENOUS | Status: AC
  Administered 2017-04-26: 1000 mL via INTRAVENOUS

## 2017-04-26 MED ORDER — POTASSIUM CHLORIDE CRYS ER 10 MEQ PO TBCR
30.0000 meq | EXTENDED_RELEASE_TABLET | Freq: Once | ORAL | Status: AC
  Administered 2017-04-26: 30 meq via ORAL
  Filled 2017-04-26: qty 1

## 2017-04-26 MED ORDER — CEFAZOLIN SODIUM-DEXTROSE 2-4 GM/100ML-% IV SOLN
2.0000 g | INTRAVENOUS | Status: DC
  Filled 2017-04-26: qty 100

## 2017-04-26 NOTE — ED Notes (Signed)
Attempt report x1  

## 2017-04-26 NOTE — Consult Note (Signed)
Referring Physician: Sanda Klein, MD    Chief Complaint: Loss of Consciousness   HPI: Dylan Stewart is an 52 y.o. male with a past medical history of dyslipidemia presents to the ED with several episodes of syncope since Wednesday, 04/24/2017.  Patient admits to a previous history of sudden syncope, first 1 occurring about 18 years ago in a plane, he came to after smelling salts,  so did not think anything of it.  He had several  similar episodes associated with lightheadedness in 2009 and had a sudden syncopal episode where he hit his head on the floor in [place] in 2017 without identifiable triggers.  He denies any extremity weakness, jerking, tremors or post-spell confusion associated with these incidents.  Wednesday 04/24/17, patient was sitting on his toilet at rest, not straining - he suddenly developed significant lightheadedness, diaphoresis, became clammy and lost consciousness for about a minute.  He states his wife didn't notice muscle twitching, jerking or eyes rolling back to his head, a postictal state after, loss of bowel or bladder or extremity weakness.  He had similar episodes in the morning of admission, and 2 syncopal episodes while sitting up for dinner-leading to about 6 total syncopal incidents that day. He states these symptoms occur in the same way: Syncope lasting about 15 seconds, without a postictal state, no extremity weakness, no loss of bowel and bladder and takes about 5 minutes to figure out what has happened to him.  He admits to an aura of lightheadedness feeling flushed, diaphoretic, clammy with vivid sensation of deja-vu, recalling various incidents that happened to him several years ago, prior to all of the syncopal episodes that have occurred in the past week, but with no such deja-vu symptoms for the more remote spells described above. Of note, the preceding deja vu memories are different for all of the spells and do not incorporate visual hallucinations, just  vivid memories. Most of his syncopal episodes have been in the sitting position but at least one has been while standing and some of his recent spells occurred while lying in bed.   He presented to the ED for evaluation and on admission, vital signs were  within normal limits. He had a witnessed syncopal episode while in triage and several while admitted.  Telemonitoring during these syncopal episodes showed junctional rhythm with rates in the 20s.  Cardiology admitted patient with plans of placing a pacemaker.  Due to possible underlying neuro component neurology was consulted for further evaluation.  On assessment patient is awake alert oriented x 3 in no acute distress with no noticeable neuro deficits.  He denies family or personal history of syncope, sudden death or seizures. He admits to having head trauma as a kid and also in 2017 where he suddenly had a syncopal episode and dropped to the floor hitting his chin and head. Outside of his syncopal episodes, patient denies chest pain, breath, recent fevers, chills, recent illnesses, neck pain or extremity weakness, visual loss, occular pain, color desaturation, recent headaches, nausea or vomiting.   Past Medical History:  Diagnosis Date  . Anxiety   . GERD (gastroesophageal reflux disease)   . Hyperlipidemia     Past Surgical History:  Procedure Laterality Date  . APPENDECTOMY    . NASAL SEPTUM SURGERY  2000  . UPPER GASTROINTESTINAL ENDOSCOPY    . WISDOM TOOTH EXTRACTION      Family History  Problem Relation Age of Onset  . Pancreatic cancer Mother   . Diabetes Paternal  Grandfather   . Diabetes Father    Social History:  reports that  has never smoked. he has never used smokeless tobacco. He reports that he drinks alcohol. He reports that he does not use drugs.  Allergies:  Allergies  Allergen Reactions  . Morphine And Related Nausea Only    Medications:  . atorvastatin  20 mg Oral Daily  . chlorhexidine  60 mL Topical Once   . chlorhexidine  60 mL Topical Once  . DULoxetine  60 mg Oral Daily  . gentamicin irrigation  80 mg Irrigation On Call  . pantoprazole  40 mg Oral Daily    ROS: 13 point systems reviewed with patient and are negative except for above in HPI  Physical Examination: Blood pressure 116/79, pulse 81, temperature 98 F (36.7 C), temperature source Oral, resp. rate 14, height 6\' 2"  (1.88 m), weight 99.9 kg (220 lb 3.8 oz), SpO2 97 %. HEENT-  Normocephalic, no lesions, without obvious abnormality.  Normal external eye and conjunctiva.   Cardiovascular- S1-S2 audible, pulses palpable throughout   Lungs-no rhonchi or wheezing noted, no excessive working breathing.  Saturations within normal limits Abdomen- All 4 quadrants palpated and nontender Musculoskeletal-no joint tenderness, deformity or swelling Skin-warm and dry, no hyperpigmentation, vitiligo, or suspicious lesions  Neurological Examination Mental Status: Alert, oriented, thought content appropriate.  Speech fluent without evidence of aphasia.  Able to follow 3 step commands without difficulty. Cranial Nerves: II: Visual fields grossly normal,  III,IV, VI: ptosis not present, extra-ocular motions intact bilaterally pupils equal, round, reactive to light and accommodation V,VII: smile symmetric, facial light touch sensation normal bilaterally VIII: Hearing intact to voice IX,X: uvula rises symmetrically XI: bilateral shoulder shrug XII: midline tongue extension Motor: Right : Upper extremity   5/5    Left:     Upper extremity   5/5  Lower extremity   5/5     Lower extremity   5/5 Tone and bulk:normal tone throughout; no atrophy noted Sensory: Pinprick and light touch intact throughout, bilaterally Deep Tendon Reflexes: 2+ and symmetric throughout Plantars: Right: downgoing   Left: downgoing Cerebellar: normal finger-to-nose, normal rapid alternating movements and normal heel-to-shin test Gait: normal gait and station   No  results found.  Assessment: 52 y.o. male with a past medical history of dyslipidemia presents to the ED with several episodes of syncope since Wednesday, 04/24/2017.  1. Recurrent Syncope  - Rule out seizure: differential diagnosis - Temporal lobe epilepsy: Pt  With reported autonomic symptoms of lightheadedness, sweating and flushing, in conjunction with unusual cognitive manifestation of deja vu sensation prior to syncopal episodes. DDx also includes primary cardiac etiology or tachy-brady syndrome leading to syncope.  Patient with normal neuro assessment.  Denies a personal or family history of stroke, seizure or sudden death.  Patient has had multiple episodes of sudden syncope without aura or warning over the last 18 years, but not as frequent in occurrence as the last 2 days, with new development of the deja-vu sensation prior to episodes. To evaluate for possible temporal lobe seizures, we will order MRI of the brain followed by LTM EEG.  Cardiology has evaluated patient and recommended pacemaker.  Will recommend and agree with holding off pacemaker insertion at this time pending full neuro workup. 2. Dyslipidemia  Recommendations: - MRI brain with and without contrast - LTM EEG after MRI - Continue Telemonitoring - Continue frequent Neurochecks  Jacob Moores DNP Neuro-hospitalist Team [phone] 04/26/2017, 12:47 PM  I have seen and examined  the patient. Assessment and Recommendations discussed with team. Electronically signed: Dr. Kerney Elbe

## 2017-04-26 NOTE — Consult Note (Addendum)
Cardiology Consultation:   Patient ID: Dylan Stewart; [id number]; [birth year]   Admit date: 04/25/2017 Date of Consult: 04/26/2017  Primary Care Provider: Crist Infante, MD Primary Electrophysiologist:  Dr. Curt Bears (once in 2017)   Patient Profile:   Dylan Stewart is a 52 y.o. male with a hx of anxiety, HLDm GERD, and hx of prior syncope, undiagnosed though suspeted to be vagally mediated who is being seen today for the evaluation of recurrent syncope and bradycardia at the request of Dr. Galt Lions.  History of Present Illness:   Dylan Stewart reports that for the last couple days he has been having dizzy spells, near syncope and has had a couple syncopal events.  2 days ago he reports waxing/waning 15 seconds or so lightheaded events, he went to the [facility] to have a BM and as he sat passed out without warning, he had not yet started to go to the BR.  He had an already scheduled visit for routine/annual labs with PMD yesterday and when there mentioned the symptoms.  He reports they did an EKG, checked his vitals, and sounds like orthostatic vitals all were reported to him to be normal.  He was scheduled to fly to [place] today and was given copies of his EKG/svitals to take with him if he had more symptoms.  He felt well the rest of the day, worked as usual, upon walking into the house he again felt weak, hot, and was able to get to a chair but fainted.  His wife wanted to bring him to the ER but he refused and they went to dinned as planned with friends, while seated at dinner he fainted without warning, this prompted the ER and while in the ER her reports 5-6 near fainting spells while on the stretcher.  He recalls similar symptoms/events in 2009 and again in 2017, very similar to the last couple days, no clear trigger, not positional.  Initially EKG was done and normal, though while on telemetry noted to have marked bradycardia with prolonged pauses and rates in the 20's, associated  with his symptoms.  Sinus slowing is observed on some of the strips (others preceding rhythm/rates are cut off/not available)  No home meds that will contribute to bradycardia  LABS K+ 3.4 BUN/Creat 12/1.00 poc Trop 0.00 WBC 12.4 H/H 15/44 Plts 283 UA unremarkable  Past Medical History:  Diagnosis Date  . Anxiety   . GERD (gastroesophageal reflux disease)   . Hyperlipidemia     Past Surgical History:  Procedure Laterality Date  . APPENDECTOMY    . NASAL SEPTUM SURGERY  2000  . UPPER GASTROINTESTINAL ENDOSCOPY    . WISDOM TOOTH EXTRACTION         Inpatient Medications: Scheduled Meds: . atorvastatin  20 mg Oral Daily  . DULoxetine  60 mg Oral Daily  . pantoprazole  40 mg Oral Daily   Continuous Infusions:  PRN Meds: acetaminophen, ondansetron (ZOFRAN) IV  Allergies:    Allergies  Allergen Reactions  . Morphine And Related Nausea Only    Social History:   Social History   Socioeconomic History  . Marital status: Married    Spouse name: [name]  . Number of children: 0  . Years of education: BS  . Highest education level: Not on file  Social Needs  . Financial resource strain: Not on file  . Food insecurity - worry: Not on file  . Food insecurity - inability: Not on file  . Transportation needs - medical: Not on  file  . Transportation needs - non-medical: Not on file  Occupational History    Employer: [employer]    Comment: [employer]  Tobacco Use  . Smoking status: Never Smoker  . Smokeless tobacco: Never Used  Substance and Sexual Activity  . Alcohol use: Yes    Comment: socially  . Drug use: No  . Sexual activity: Not on file  Other Topics Concern  . Not on file  Social History Narrative   Patient lives at home with spouse.   Caffeine Use: 40 oz daily    Family History:    Family History  Problem Relation Age of Onset  . Pancreatic cancer Mother   . Diabetes Paternal Grandfather   . Diabetes Father      ROS:  Please see  the history of present illness.   All other ROS reviewed and negative.     Physical Exam/Data:   Vitals:   04/26/17 0319 04/26/17 0330 04/26/17 0500 04/26/17 0820  BP: (!) 91/51 117/70 116/79   Pulse: 80 73 81   Resp: 13 11 14    Temp:   98.4 F (36.9 C) 98 F (36.7 C)  TempSrc:   Oral Oral  SpO2: 100% 95% 97%   Weight:   220 lb 3.8 oz (99.9 kg)   Height:   6\' 2"  (1.88 m)     Intake/Output Summary (Last 24 hours) at 04/26/2017 9379 Last data filed at 04/26/2017 0308 Gross per 24 hour  Intake 1000 ml  Output -  Net 1000 ml   Filed Weights   04/25/17 2239 04/26/17 0500  Weight: 218 lb (98.9 kg) 220 lb 3.8 oz (99.9 kg)   Body mass index is 28.28 kg/m.  General:  Well nourished, well developed, in no acute distress HEENT: normal Lymph: no adenopathy Neck: no JVD Endocrine:  No thryomegaly Vascular: No carotid bruits Cardiac:  RRR; no murmurs, gallops or rubs Lungs:  CTA b/l, no wheezing, rhonchi or rales  Abd: soft, nontender, no hepatomegaly  Ext: no edema Musculoskeletal:  No deformities Skin: warm and dry  Neuro:  no focal abnormalities noted Psych:  Normal affect   EKG:  The EKG was personally reviewed and demonstrates:   #1 SR 80bpm, PR 170ms, QRS 7ms, QTc 310ms #2/3 SB/junctional beats, 47bpm/43bpm, QRS 67ms/91ms Telemetry:  Telemetry was personally reviewed and demonstrates:   Since admission SR 70's, no brady events  Relevant CV Studies:  05/17/15 TTE ([place] LVEF 60-65% No significant VHD is described  Laboratory Data:  Chemistry Recent Labs  Lab 04/25/17 2251  NA 133*  K 3.4*  CL 96*  CO2 25  GLUCOSE 108*  BUN 12  CREATININE 1.00  CALCIUM 9.3  GFRNONAA >60  GFRAA >60  ANIONGAP 12    No results for input(s): PROT, ALBUMIN, AST, ALT, ALKPHOS, BILITOT in the last 168 hours. Hematology Recent Labs  Lab 04/25/17 2251  WBC 12.4*  RBC 4.89  HGB 15.5  HCT 44.9  MCV 91.8  MCH 31.7  MCHC 34.5  RDW 13.2  PLT 283    Cardiac EnzymesNo results for input(s): TROPONINI in the last 168 hours.  Recent Labs  Lab 04/25/17 2250  TROPIPOC 0.00    BNPNo results for input(s): BNP, PROBNP in the last 168 hours.  DDimer No results for input(s): DDIMER in the last 168 hours.  Radiology/Studies:  No results found.  Assessment and Plan:   1. Recurrent near syncope and syncope     Likely vagal,  though associated with marked bradycardia and pauses, symptoms     No trigger is identified     No reversible causes noted  PPM is recommended.  The patient is having some difficulty wrapping bis head around the idea, and will discuss further with Dr. Renard Matter is ordered and asked to be done ASAP I will get TSH  Will hold NPO and he is placed tentatively on the scheduled for today if he becomes agreeable.   2. Mild hypokalenia     Will replace  3. Mild leukocytosis     No symptoms of illness     Negative UA     afebrile    For questions or updates, please contact [place] Please consult [email or web address] for contact info under Cardiology/STEMI.   Signed, Baldwin Jamaica, PA-C  04/26/2017 8:22 AM    Syncope and presyncope  Hyper vagotonia sinus node dysfunction  Visual premonitory symptoms  Patient interviewed and examined.  History is notable for a prodrome prior to his events of visual things, seeing things from the past, almost like a dj vu.  Each event has been preceded by a visual sign.  1 of the episodes occurred on the commode as a possible trigger.  Other events has not had a trigger.  In the emergency room incremental PP prolongation was noted consistent with a hyper vagotonia can affect.    Interestingly, however, there is also been protracted sinus node dysfunction with junctional rhythm.  I am not sure whether his episodes are triggered by temporal lobe epilepsy and should be treated with antiepileptic therapy or whether it is a sinus node issue.  The latter clearly would not  explain his visual prodrome.  I would have to do some research but I have never seen this with a primary neurocardiogenic event.  I called and spoke with Dr. Cheral Marker who will see the patient in consultation.  With the history of accelerated events over recent years, there may be a need for neurological imaging.  At the end of the neurological evaluation, we will need to decide as to how to proceed.

## 2017-04-26 NOTE — ED Provider Notes (Signed)
[facility] DEPT Provider Note   CSN: [id number] Arrival date & time: 04/25/17  2131     History   Chief Complaint Chief Complaint  Patient presents with  . Loss of Consciousness    HPI Dylan Stewart is a 52 y.o. male.  HPI Patient presents to the emergency department with multiple syncopal episodes over the last 24 hours.  Patient states last night he passed out and this episode lasted for about 45 seconds he called his primary doctor this morning who saw him in their office and did testing and then he felt fine the rest the day.  The patient states around 6 PM when he got home he noted he felt lightheaded once he walked into the kitchen and then passed out the wife states that this lasted about 45 seconds to 1 minute.  Patient states that he was sweaty and started feeling normal again so they went to dinner and he passed out with similar symptoms at dinner.  He then went home and had another episode of syncope.  They then came to the emergency department by private vehicle he states in the car he felt like he was going to pass out again and he laid the seat back and he did not pass out.  The patient was then brought to our triage room where he had another syncopal episode and was noted to have a heart rate in the 20s and was diaphoretic.  The patient states that he had several episodes of syncope a few years back and was evaluated by cardiologist here in [place] but did not find any abnormalities at that time.  Patient states he is not treated for any current medical problems.  The patient states that nothing seemed to make the condition better or worse.  States he is not take any new supplements or drugs.  And he does not use any illicit drugs.  The patient denies chest pain, shortness of breath, headache,blurred vision, neck pain, fever, cough, weakness, numbness, dizziness, anorexia, edema, abdominal pain, nausea, vomiting, diarrhea, rash, back pain,  dysuria, hematemesis, bloody stool. Past Medical History:  Diagnosis Date  . Anxiety   . GERD (gastroesophageal reflux disease)   . Hyperlipidemia     Patient Active Problem List   Diagnosis Date Noted  . Chronic cough 11/16/2015  . GERD (gastroesophageal reflux disease) 11/16/2015  . Allergic rhinitis 11/16/2015  . NONSPECIFIC ABN FINDING RAD & OTH EXAM GI TRACT 12/29/2007    Past Surgical History:  Procedure Laterality Date  . APPENDECTOMY    . NASAL SEPTUM SURGERY  2000  . UPPER GASTROINTESTINAL ENDOSCOPY    . WISDOM TOOTH EXTRACTION         Home Medications    Prior to Admission medications   Medication Sig Start Date End Date Taking? Authorizing Provider  acetaminophen (TYLENOL) 500 MG tablet Take 1,000 mg by mouth every 6 (six) hours as needed for pain.    Yes [provider]  atorvastatin (LIPITOR) 20 MG tablet Take 20 mg by mouth daily. 05/06/15  Yes [provider]  cetirizine (ZYRTEC) 10 MG tablet Take 10 mg by mouth as needed for allergies.   Yes [provider]  DULoxetine (CYMBALTA) 60 MG capsule Take 60 mg by mouth daily.   Yes [provider]  esomeprazole (NEXIUM) 40 MG capsule Take 40 mg by mouth daily before breakfast.   Yes [provider]    Family History Family History  Problem Relation Age  of Onset  . Pancreatic cancer Mother   . Diabetes Paternal Grandfather   . Diabetes Father     Social History Social History   Tobacco Use  . Smoking status: Never Smoker  . Smokeless tobacco: Never Used  Substance Use Topics  . Alcohol use: Yes    Comment: socially  . Drug use: No     Allergies   Morphine and related   Review of Systems Review of Systems  All other systems negative except as documented in the HPI. All pertinent positives and negatives as reviewed in the HPI. Physical Exam Updated Vital Signs BP (!) 99/55   Pulse 78   Temp 98.5 F (36.9 C) (Oral)   Resp (!) 8   Ht 6\' 2"  (1.88  m)   Wt 98.9 kg (218 lb)   SpO2 97%   BMI 27.99 kg/m   Physical Exam  Constitutional: He is oriented to person, place, and time. He appears well-developed and well-nourished. No distress.  HENT:  Head: Normocephalic and atraumatic.  Mouth/Throat: Oropharynx is clear and moist.  Eyes: Pupils are equal, round, and reactive to light.  Neck: Normal range of motion. Neck supple.  Cardiovascular: Normal rate, regular rhythm and normal heart sounds. Exam reveals no gallop and no friction rub.  No murmur heard. Pulmonary/Chest: Effort normal and breath sounds normal. No respiratory distress. He has no wheezes.  Abdominal: Soft. Bowel sounds are normal. He exhibits no distension. There is no tenderness.  Neurological: He is alert and oriented to person, place, and time. He exhibits normal muscle tone. Coordination normal.  Skin: Skin is warm and dry. Capillary refill takes less than 2 seconds. No rash noted. No erythema.  Psychiatric: He has a normal mood and affect. His behavior is normal.  Nursing note and vitals reviewed.    ED Treatments / Results  Labs (all labs ordered are listed, but only abnormal results are displayed) Labs Reviewed  BASIC METABOLIC PANEL - Abnormal; Notable for the following components:      Result Value   Sodium 133 (*)    Potassium 3.4 (*)    Chloride 96 (*)    Glucose, Bld 108 (*)    All other components within normal limits  CBC - Abnormal; Notable for the following components:   WBC 12.4 (*)    All other components within normal limits  CBG MONITORING, ED - Abnormal; Notable for the following components:   Glucose-Capillary 115 (*)    All other components within normal limits  URINALYSIS, ROUTINE W REFLEX MICROSCOPIC  I-STAT TROPONIN, ED    EKG  EKG Interpretation None       Radiology No results found.  Procedures Procedures (including critical care time)  Medications Ordered in ED Medications  sodium chloride 0.9 % bolus 1,000 mL  (1,000 mLs Intravenous New Bag/Given 04/26/17 0032)     Initial Impression / Assessment and Plan / ED Course  I have reviewed the triage vital signs and the nursing notes.  Pertinent labs & imaging results that were available during my care of the patient were reviewed by me and considered in my medical decision making (see chart for details).     Spoke with cardiology who states that the patient may need to be admitted by the hospitalist.  The patient does not have any current medical problems and I spoke with the [facility] who feels that this needs to be readmitted by cardiology I will call them back for further assessment of the  situation.  I will have the hospitalist and cardiology speak.   Final Clinical Impressions(s) / ED Diagnoses   Final diagnoses:  None    ED Discharge Orders    None       Dalia Heading, PA-C 04/26/17 2574    Varney Biles, MD 04/26/17 [place], Ankit, MD 04/26/17 1500

## 2017-04-26 NOTE — H&P (Signed)
Cardiology History & Physical    Patient ID: Dylan Stewart MRN: [id number], DOB: 03-30-1965 Date of Encounter: 04/26/2017, 5:56 AM Primary Physician: Crist Infante, MD  Chief Complaint: Syncope   HPI: Dylan Stewart is a 52 y.o. male with history of hyperlipidemia, presents following several syncopal episodes.  Patient has periodically had rare syncopal events with no clear identifiable triggers in the past.  He was evaluated by Dr. Curt Bears in March 2017, and no underlying cause was apparent at the time.  He has not had an episode since early 2017 until the night before admission.  Initially he was on the toilet, not straining, when he suddenly developed significant lightheadedness and diaphoresis diaphoresis and lost consciousness for about a minute.  He felt fine afterward, and the following day he had an EKG for routine physical which was unremarkable.  He also had orthostatic vital signs which were normal per report.  On the evening of presentation, he had 2 unheralded syncopal episodes while sitting up at dinner.  Subsequently, he presented to [facility] ED for evaluation.  There, initial vital signs were within he had an episode of syncope while sitting up in triage, and multiple as episodes of lightheadedness, diaphoresis, and near syncope while laying in the bed.  Telemetry monitoring during these episodes shows likely junctional rhythm with rates in the 20s. There was one ECG with an unusual P wave axis that raises the possibility of an ectopic atrial rhythm.  At no point with these episodes has he had any chest pain or shortness of breath.  His baseline ECG and lab work were all within normal limits.    He denies any prior history of PND, exertional dyspnea, lower extremity edema, or palpitations.  He cannot identify any clear triggers for these episodes, he does note that for the most of the episodes he can recall he was in a sitting position.  These are not triggered by tight  collars or specific movements of the neck.  He has no significant family history of cardiac disease or sudden cardiac death, however he does note that his brother has had very similar syncopal episodes.   Past Medical History:  Diagnosis Date  . Anxiety   . GERD (gastroesophageal reflux disease)   . Hyperlipidemia      Surgical History:  Past Surgical History:  Procedure Laterality Date  . APPENDECTOMY    . NASAL SEPTUM SURGERY  2000  . UPPER GASTROINTESTINAL ENDOSCOPY    . WISDOM TOOTH EXTRACTION       Home Meds: Prior to Admission medications   Medication Sig Start Date End Date Taking? Authorizing Provider  acetaminophen (TYLENOL) 500 MG tablet Take 1,000 mg by mouth every 6 (six) hours as needed for pain.    Yes [provider]  atorvastatin (LIPITOR) 20 MG tablet Take 20 mg by mouth daily. 05/06/15  Yes [provider]  cetirizine (ZYRTEC) 10 MG tablet Take 10 mg by mouth as needed for allergies.   Yes [provider]  DULoxetine (CYMBALTA) 60 MG capsule Take 60 mg by mouth daily.   Yes [provider]  esomeprazole (NEXIUM) 40 MG capsule Take 40 mg by mouth daily before breakfast.   Yes [provider]    Allergies:  Allergies  Allergen Reactions  . Morphine And Related Nausea Only    Social History   Socioeconomic History  . Marital status: Married    Spouse name: Dylan Stewart  . Number of children: 0  .  Years of education: BS  . Highest education level: Not on file  Social Needs  . Financial resource strain: Not on file  . Food insecurity - worry: Not on file  . Food insecurity - inability: Not on file  . Transportation needs - medical: Not on file  . Transportation needs - non-medical: Not on file  Occupational History    Employer: [employer]    Comment: [employer]  Tobacco Use  . Smoking status: Never Smoker  . Smokeless tobacco: Never Used  Substance and Sexual Activity  . Alcohol use: Yes    Comment:  socially  . Drug use: No  . Sexual activity: Not on file  Other Topics Concern  . Not on file  Social History Narrative   Patient lives at home with spouse.   Caffeine Use: 40 oz daily     Family History  Problem Relation Age of Onset  . Pancreatic cancer Mother   . Diabetes Paternal Grandfather   . Diabetes Father     Review of Systems: All other systems reviewed and are otherwise negative except as noted above.  Labs:   Lab Results  Component Value Date   WBC 12.4 (H) 04/25/2017   HGB 15.5 04/25/2017   HCT 44.9 04/25/2017   MCV 91.8 04/25/2017   PLT 283 04/25/2017    Recent Labs  Lab 04/25/17 2251  NA 133*  K 3.4*  CL 96*  CO2 25  BUN 12  CREATININE 1.00  CALCIUM 9.3  GLUCOSE 108*   No results for input(s): CKTOTAL, CKMB, TROPONINI in the last 72 hours. No results found for: CHOL, HDL, LDLCALC, TRIG No results found for: DDIMER  Radiology/Studies:  No results found. Wt Readings from Last 3 Encounters:  04/26/17 99.9 kg (220 lb 3.8 oz)  04/27/16 98.9 kg (218 lb)  04/10/16 99.2 kg (218 lb 9.6 oz)    EKG: Baseline ECG with normal sinus rhythm.  112-lead ECG with heart rate of 43 bpm possibly consistent with ectopic atrial rhythm.  Rhythm strip showing likely junctional escape with rate in the 20s.  Physical Exam: Blood pressure 116/79, pulse 81, temperature 98.4 F (36.9 C), temperature source Oral, resp. rate 14, height 6\' 2"  (1.88 m), weight 99.9 kg (220 lb 3.8 oz), SpO2 97 %. Body mass index is 28.28 kg/m. General: Well developed, well nourished, in no acute distress. Head: Normocephalic, atraumatic, sclera non-icteric, no xanthomas, nares are without discharge.  Neck: Negative for carotid bruits. JVD not elevated. Lungs: Clear bilaterally to auscultation without wheezes, rales, or rhonchi. Breathing is unlabored. Heart: RRR with S1 S2. No murmurs, rubs, or gallops appreciated. Abdomen: Soft, non-tender, non-distended with normoactive bowel sounds.  No hepatomegaly. No rebound/guarding. No obvious abdominal masses. Msk:  Strength and tone appear normal for age. Extremities: No clubbing or cyanosis. No edema.  Distal pedal pulses are 2+ and equal bilaterally. Neuro: Alert and oriented X 3. No focal deficit. No facial asymmetry. Moves all extremities spontaneously. Psych:  Responds to questions appropriately with a normal affect.    Assessment and Plan   52 year old male with no significant past medical history who presents with episodes of unheralded syncope, which have now been associated with significant bradycardia.    1.  Syncope: The underlying issue seems to be sinus node dysfunction, there are no clear triggers or inciting events for this.  His brother does have a history of similar syncopal episodes, which raises the possibility of a genetically mediated channelopathy.  We will  plan for an echocardiogram and EP consultation in the morning.  A leadless pacemaker would be an appropriate device for him, given infrequent need for pacing and lower infectious risk longitudinally.  2.  Hyperlipidemia: Continue home statin.  3.  Anxiety: Continue home duloxetine.  4.  GERD: Substitute pantoprazole for home PPI.   Signed, Doylene Canning, MD 04/26/2017, 5:56 AM

## 2017-04-26 NOTE — ED Notes (Signed)
Pt experienced another episode of bradycardia, HR went into the low 40s, BP dropped to low 480X systolic. Whole incident lasted 20 seconds or so.

## 2017-04-26 NOTE — ED Notes (Signed)
Pt had another episode of bradycardia. His HR dropped to the high 30s and his BP then dropped to systolic 87. EKG was taken and documented. The whole episode lasted around 45 seconds and his V/S started to normalize again. Pt was diaphoretic and light headed during episode. Denies pain, SOB, N/V.

## 2017-04-26 NOTE — Progress Notes (Signed)
  Echocardiogram 2D Echocardiogram has been performed.  Dylan Stewart 04/26/2017, 10:13 AM

## 2017-04-26 NOTE — ED Notes (Signed)
ED TO INPATIENT HANDOFF REPORT  Name/Age/Gender Dylan Stewart 52 y.o. male  Code Status Code Status History    This patient does not have a recorded code status. Please follow your organizational policy for patients in this situation.      Home/SNF/Other Home  Chief Complaint Loss of consciousness  Level of Care/Admitting Diagnosis ED Disposition    ED Disposition Condition [facility] Area: [place] [100100]  Level of Care: Stepdown [14]  Diagnosis: Syncope [759163]  Admitting Physician: Doylene Canning [[id number]]  Attending Physician: Doylene Canning [[id number]]  Estimated length of stay: past midnight tomorrow  Certification:: I certify this patient will need inpatient services for at least 2 midnights  PT Class (Do Not Modify): Inpatient [101]  PT Acc Code (Do Not Modify): Private [1]       Medical History Past Medical History:  Diagnosis Date  . Anxiety   . GERD (gastroesophageal reflux disease)   . Hyperlipidemia     Allergies Allergies  Allergen Reactions  . Morphine And Related Nausea Only    IV Location/Drains/Wounds Patient Lines/Drains/Airways Status   Active Line/Drains/Airways    Name:   Placement date:   Placement time:   Site:   Days:   Peripheral IV 10/03/13 Left Hand   10/03/13    1152    Hand   1301          Labs/Imaging Results for orders placed or performed during the hospital encounter of 04/25/17 (from the past 48 hour(s))  CBG monitoring, ED     Status: Abnormal   Collection Time: 04/25/17 10:26 PM  Result Value Ref Range   Glucose-Capillary 115 (H) 65 - 99 mg/dL  I-stat troponin, ED     Status: None   Collection Time: 04/25/17 10:50 PM  Result Value Ref Range   Troponin i, poc 0.00 0.00 - 0.08 ng/mL   Comment 3            Comment: Due to the release kinetics of cTnI, a negative result within the first hours of the onset of symptoms does not rule out myocardial infarction with  certainty. If myocardial infarction is still suspected, repeat the test at appropriate intervals.   Basic metabolic panel     Status: Abnormal   Collection Time: 04/25/17 10:51 PM  Result Value Ref Range   Sodium 133 (L) 135 - 145 mmol/L   Potassium 3.4 (L) 3.5 - 5.1 mmol/L   Chloride 96 (L) 101 - 111 mmol/L   CO2 25 22 - 32 mmol/L   Glucose, Bld 108 (H) 65 - 99 mg/dL   BUN 12 6 - 20 mg/dL   Creatinine, Ser 1.00 0.61 - 1.24 mg/dL   Calcium 9.3 8.9 - 10.3 mg/dL   GFR calc non Af Amer >60 >60 mL/min   GFR calc Af Amer >60 >60 mL/min    Comment: (NOTE) The eGFR has been calculated using the CKD EPI equation. This calculation has not been validated in all clinical situations. eGFR's persistently <60 mL/min signify possible Chronic Kidney Disease.    Anion gap 12 5 - 15    Comment: Performed at [facility], [place] [address], [place], [place] [address]  CBC     Status: Abnormal   Collection Time: 04/25/17 10:51 PM  Result Value Ref Range   WBC 12.4 (H) 4.0 - 10.5 K/uL   RBC 4.89 4.22 - 5.81 MIL/uL   Hemoglobin 15.5 13.0 - 17.0 g/dL  HCT 44.9 39.0 - 52.0 %   MCV 91.8 78.0 - 100.0 fL   MCH 31.7 26.0 - 34.0 pg   MCHC 34.5 30.0 - 36.0 g/dL   RDW 13.2 11.5 - 15.5 %   Platelets 283 150 - 400 K/uL    Comment: Performed at [facility], [place] [address] [place], [place] [address]   No results found.  Pending Labs Unresulted Labs (From admission, onward)   Start     Ordered   04/25/17 2213  Urinalysis, Routine w reflex microscopic  Once,   STAT     04/25/17 2213      Vitals/Pain Today's Vitals   04/26/17 0030 04/26/17 0130 04/26/17 0200 04/26/17 0232  BP: 119/72 131/69 140/68 128/76  Pulse: 83 87 89 83  Resp: '14 15 15 16  ' Temp:      TempSrc:      SpO2: 97% 97% 99% 95%  Weight:      Height:        Isolation Precautions No active isolations  Medications Medications  sodium chloride 0.9 % bolus 1,000 mL (0 mLs Intravenous Stopped  04/26/17 0308)    Mobility walks

## 2017-04-27 ENCOUNTER — Inpatient Hospital Stay (HOSPITAL_COMMUNITY): Payer: BLUE CROSS/BLUE SHIELD

## 2017-04-27 MED ORDER — GADOBENATE DIMEGLUMINE 529 MG/ML IV SOLN
20.0000 mL | Freq: Once | INTRAVENOUS | Status: AC | PRN
  Administered 2017-04-27: 20 mL via INTRAVENOUS

## 2017-04-27 NOTE — Progress Notes (Addendum)
Progress Note  Patient Name: Dylan Stewart Date of Encounter: 04/27/2017  Primary Cardiologist:  SK  Primary Electrophysiologist: SK   Patient Profile     52 y.o. 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  Also noted to have visual-dj vu prior to his events.  Currently undergoing evaluation for temporal lobe seizures  Subjective   Without chest pain shortness of breath or recurrent events  Inpatient Medications    Scheduled Meds: . atorvastatin  20 mg Oral Daily  . DULoxetine  60 mg Oral Daily  . pantoprazole  40 mg Oral Daily   Continuous Infusions:  PRN Meds: acetaminophen, ondansetron (ZOFRAN) IV   Vital Signs    Vitals:   04/27/17 0000 04/27/17 0020 04/27/17 0328 04/27/17 0745  BP: 105/81 105/81 107/72 118/78  Pulse: 77 82 68 77  Resp: 14 13 10 14   Temp:  98.4 F (36.9 C) 97.8 F (36.6 C) 98 F (36.7 C)  TempSrc:  Oral Oral Oral  SpO2:  95% 95% 99%  Weight:      Height:        Intake/Output Summary (Last 24 hours) at 04/27/2017 1136 Last data filed at 04/26/2017 1630 Gross per 24 hour  Intake 444 ml  Output -  Net 444 ml   Filed Weights   04/25/17 2239 04/26/17 0500  Weight: 218 lb (98.9 kg) 220 lb 3.8 oz (99.9 kg)    Telemetry    normal - Personally Reviewed  ECG    none - Personally Reviewed  Physical Exam   GEN: No acute distress.   Neck: JVD flat Cardiac: RRR, no  murmurs, rubs, or gallops.  Respiratory: Clear to auscultation bilaterally. GI: Soft, nontender, non-distended  MS:  edema; No deformity. Neuro:  Nonfocal  Psych: Normal affect  Skin Warm and dry   Labs    Chemistry Recent Labs  Lab 04/25/17 2251  NA 133*  K 3.4*  CL 96*  CO2 25  GLUCOSE 108*  BUN 12  CREATININE 1.00  CALCIUM 9.3  GFRNONAA >60  GFRAA >60  ANIONGAP 12     Hematology Recent Labs  Lab 04/25/17 2251  WBC 12.4*  RBC 4.89  HGB 15.5  HCT 44.9  MCV 91.8  MCH 31.7  MCHC 34.5  RDW 13.2  PLT 283      Cardiac EnzymesNo results for input(s): TROPONINI in the last 168 hours.  Recent Labs  Lab 04/25/17 2250  TROPIPOC 0.00     BNPNo results for input(s): BNP, PROBNP in the last 168 hours.   DDimer No results for input(s): DDIMER in the last 168 hours.   Radiology    Mr Jeri Cos Wo Contrast  Result Date: 04/27/2017 CLINICAL DATA:  Seizure.  Syncope EXAM: MRI HEAD WITHOUT AND WITH CONTRAST TECHNIQUE: Multiplanar, multiecho pulse sequences of the brain and surrounding structures were obtained without and with intravenous contrast. CONTRAST:  57mL MULTIHANCE GADOBENATE DIMEGLUMINE 529 MG/ML IV SOLN COMPARISON:  None. FINDINGS: Brain: No acute infarction, hemorrhage, hydrocephalus, extra-axial collection or mass lesion. Vascular: Normal flow voids. Skull and upper cervical spine: Negative Sinuses/Orbits: Negative orbit. Mild mucosal edema paranasal sinuses Other: None IMPRESSION: Normal MRI brain with contrast Mild mucosal edema paranasal sinuses Electronically Signed   By: Franchot Gallo M.D.   On: 04/27/2017 07:36    Cardiac Studies     Device Interrogation        Assessment & Plan    Spells-sinus node dysfunction-hyper vagotonia  MRI imaging of his brain is normal.  He is being wired for 24-hour EEG.  Have spoken with neurology as to the plan evaluation  In the event that this is nondiagnostic we will have to come up with a clinical judgment as to whether this represents temporal lobe epilepsy or not as this would inform the need for pacing  Signed, Virl Axe, MD  04/27/2017, 11:36 AM

## 2017-04-27 NOTE — Progress Notes (Signed)
LTM EEG initiated.  Pt and staff educated.

## 2017-04-27 NOTE — Progress Notes (Signed)
MRI brain normal. LTM EEG has been ordered.   Electronically signed: Dr. Kerney Elbe

## 2017-04-28 LAB — BASIC METABOLIC PANEL
Anion gap: 11 (ref 5–15)
BUN: 12 mg/dL (ref 6–20)
CO2: 25 mmol/L (ref 22–32)
Calcium: 9.3 mg/dL (ref 8.9–10.3)
Chloride: 102 mmol/L (ref 101–111)
Creatinine, Ser: 0.96 mg/dL (ref 0.61–1.24)
GFR calc Af Amer: >60 mL/min (ref >60)
GFR calc non Af Amer: >60 mL/min (ref >60)
Glucose, Bld: 147 mg/dL — ABNORMAL HIGH (ref 65–99)
Potassium: 3.3 mmol/L — ABNORMAL LOW (ref 3.5–5.1)
Sodium: 138 mmol/L (ref 135–145)

## 2017-04-28 MED ORDER — POTASSIUM CHLORIDE CRYS ER 20 MEQ PO TBCR
40.0000 meq | EXTENDED_RELEASE_TABLET | Freq: Once | ORAL | Status: AC
  Administered 2017-04-28: 40 meq via ORAL
  Filled 2017-04-28: qty 2

## 2017-04-28 NOTE — Progress Notes (Addendum)
Brief History Dylan Stewart is an 52 y.o. male who presented to the ED with several episodes of syncope since Wednesday, 04/24/2017. One episode was described as suddenly developing significant lightheadedness, diaphoresis, became clammy and lost consciousness for about a minute.  He states his wife didn't notice muscle twitching, jerking or eyes rolling back to his head, a postictal state after, loss of bowel or bladder or extremity weakness.  He had similar episodes in the morning of admission, and 2 syncopal episodes while sitting up for dinner-leading to about 6 total syncopal incidents that day. He states these symptoms occur in the same way. He admits to an aura of lightheadedness feeling flushed, diaphoretic, clammy with vivid sensation of deja-vu, recalling various incidents that happened to him several years ago, prior to all of the syncopal episodes that have occurred in the past week, but with no such deja-vu symptoms for the more remote spells described above. Of note, the deja-vu memories are different for all of the spells and do not incorporate visual hallucinations, just vivid memories. Most of his syncopal episodes have been in the sitting position but at least one has been while standing and some of his recent spells occurred while lying in bed.   Telemonitoring during these syncopal episodes showed junctional rhythm with rates in the 20s.  Cardiology admitted patient with plans of placing a pacemaker.  Due to possible underlying neuro component neurology was consulted for further evaluation.cEEG has been normal thus far, but pt reports no spells as described above.   Subjective: The patient states that he would like monitoring to be discontinued so that he can be discharged home.    Past Medical History Past Medical History:  Diagnosis Date  . Anxiety   . GERD (gastroesophageal reflux disease)   . Hyperlipidemia     Past Surgical History Past Surgical History:  Procedure  Laterality Date  . APPENDECTOMY    . NASAL SEPTUM SURGERY  2000  . UPPER GASTROINTESTINAL ENDOSCOPY    . WISDOM TOOTH EXTRACTION      Allergies Allergies  Allergen Reactions  . Morphine And Related Nausea Only    Home Medications Facility-Administered Medications Prior to Admission  Medication Dose Route Frequency Provider Last Rate Last Dose  . 0.9 %  sodium chloride infusion  500 mL Intravenous Continuous Armbruster, Carlota Raspberry, MD       Medications Prior to Admission  Medication Sig Dispense Refill  . acetaminophen (TYLENOL) 500 MG tablet Take 1,000 mg by mouth every 6 (six) hours as needed for pain.     Marland Kitchen atorvastatin (LIPITOR) 20 MG tablet Take 20 mg by mouth daily.  3  . cetirizine (ZYRTEC) 10 MG tablet Take 10 mg by mouth as needed for allergies.    . DULoxetine (CYMBALTA) 60 MG capsule Take 60 mg by mouth daily.    Marland Kitchen esomeprazole (NEXIUM) 40 MG capsule Take 40 mg by mouth daily before breakfast.      Hospital Medications . atorvastatin  20 mg Oral Daily  . DULoxetine  60 mg Oral Daily  . pantoprazole  40 mg Oral Daily     Objective  Intake/Output from previous day: 02/16 0701 - 02/17 0700 In: -  Out: 650 [Urine:650] Intake/Output this shift: No intake/output data recorded. Nutritional status:  Seizure precautions Diet Heart Room service appropriate? Yes; Fluid consistency: Thin   Physical Exam -  Vitals:   04/27/17 0745 04/27/17 2002 04/27/17 2345 04/28/17 0429  BP: 118/78 118/78 127/90 95/63  Pulse:  77 87 68 70  Resp: 14 (!) 23 15 13   Temp: 98 F (36.7 C) 98.2 F (36.8 C) 98.7 F (37.1 C) 98.1 F (36.7 C)  TempSrc: Oral Axillary Oral Oral  SpO2: 99% 95% 94% 91%  Weight:      Height:       General - no acute distress, healthy man appearing stated age Heart - Regular rate and rhythm - no murmer Lungs - Clear to auscultation Abdomen - Soft - non tender Extremities - Distal pulses intact - no edema Skin - Warm and dry  Neurologic Exam: Mental  Status:  Alert, oriented, thought content appropriate. Speech without evidence of dysarthria or aphasia. Able to follow 3 step commands without difficulty.  Cranial Nerves:  II-bilateral visual fields intact III/IV/VI-Pupils were equal and reactive. Extraocular movements were full.  V/VII-no facial numbness and no facial weakness.  VIII-hearing normal.  X-normal speech and symmetrical palatal movement.  XII-midline tongue extension  Motor: Right : Upper extremity   5/5    Left:     Upper extremity   5/5  Lower extremity   5/5     Lower extremity   5/5 Tone and bulk:normal tone throughout; no atrophy noted Sensory: Intact to light touch in all extremities. Deep Tendon Reflexes: 2/4 throughout Plantars: Downgoing bilaterally  Cerebellar: Normal finger to nose and heel to shin bilaterally. Gait: not tested  LABORATORY RESULTS:  Basic Metabolic Panel: Recent Labs  Lab 04/25/17 2251  NA 133*  K 3.4*  CL 96*  CO2 25  GLUCOSE 108*  BUN 12  CREATININE 1.00  CALCIUM 9.3    Liver Function Tests: No results for input(s): AST, ALT, ALKPHOS, BILITOT, PROT, ALBUMIN in the last 168 hours. No results for input(s): LIPASE, AMYLASE in the last 168 hours. No results for input(s): AMMONIA in the last 168 hours.  CBC: Recent Labs  Lab 04/25/17 2251  WBC 12.4*  HGB 15.5  HCT 44.9  MCV 91.8  PLT 283    Cardiac Enzymes: No results for input(s): CKTOTAL, CKMB, CKMBINDEX, TROPONINI in the last 168 hours.  Lipid Panel: No results for input(s): CHOL, TRIG, HDL, CHOLHDL, VLDL, LDLCALC in the last 168 hours.  CBG: Recent Labs  Lab 04/25/17 2226  GLUCAP 115*    Microbiology:   Coagulation Studies: No results for input(s): LABPROT, INR in the last 72 hours.  Miscellaneous Labs:   IMAGING RESULTS Mr Jeri Cos ZO Contrast  Result Date: 04/27/2017 CLINICAL DATA:  Seizure.  Syncope EXAM: MRI HEAD WITHOUT AND WITH CONTRAST TECHNIQUE: Multiplanar, multiecho pulse sequences of the  brain and surrounding structures were obtained without and with intravenous contrast. CONTRAST:  63mL MULTIHANCE GADOBENATE DIMEGLUMINE 529 MG/ML IV SOLN COMPARISON:  None. FINDINGS: Brain: No acute infarction, hemorrhage, hydrocephalus, extra-axial collection or mass lesion. Vascular: Normal flow voids. Skull and upper cervical spine: Negative Sinuses/Orbits: Negative orbit. Mild mucosal edema paranasal sinuses Other: None IMPRESSION: Normal MRI brain with contrast Mild mucosal edema paranasal sinuses Electronically Signed   By: Franchot Gallo M.D.   On: 04/27/2017 07:36     EEG: Reviewed, there have been no seizures or any seizure focus seen. No spells reported by pt.  Assessment:  52 yr old male with spells of syncope associated with pre-deja-vu - Recurrent Syncope. 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 Pt  With reported autonomic symptoms of lightheadedness, sweating and flushing, in conjunction with unusual cognitive manifestation of deja vu sensation prior to syncopal episodes.    -  DDx also includes primary cardiac etiology or tachy-brady syndrome leading to syncope.   - Patient with normal neuro assessment.  Denies a personal or family history of stroke, seizure or sudden death.  - Patient has had multiple episodes of sudden syncope without aura or warning over the last 18 years, but not as frequent in occurrence as the last 2 days, with the preceding deja-vu sensation only associated with the recent episodes  - MRI brain negative - Dyslipidemia- medication mgt   Recommendations: - We would like to continue the cEEG for another 24h to see if we can capture a spell, however pt feels like he is "done" with this testing. He would like to discuss further plan with Dr Caryl Comes - Please notify us of cardiology plan, we will be happy to d/c cEEG sooner if pt and primary team would prefer, otherwise we will check back again tomorrow. One additional option for capturing  spells would be an ambulatory EEG, which would need to be set up on an outpatient basis  Electronically signed: Dr. Kerney Elbe

## 2017-04-28 NOTE — Progress Notes (Addendum)
Spoke with Mattie Marlin regarding EEG, she advised neuro wants additional 24 hours EEG monitoring which patient did not feel necessary. Dr. Caryl Comes paged, advises that he agrees with neuro. This RN relayed info to patient, patient agreeable to continue EEG for another 24 hours. EEG paged to advise of continuation.  1556: Cardiology NP paged in regards to K level, will await new orders or further instructions.

## 2017-04-28 NOTE — Progress Notes (Signed)
LTM EEG in progress.  Electrodes checked, no skin break down noted.

## 2017-04-28 NOTE — Progress Notes (Signed)
Progress Note  Patient Name: Dylan Stewart Date of Encounter: 04/28/2017  Primary Cardiologist:  SK  Primary Electrophysiologist: SK   Patient Profile     52 y.o. 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  Also noted to have visual-dj vu prior to his events.  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 atorvastatin  20 mg Oral Daily  . DULoxetine  60 mg Oral Daily  . pantoprazole  40 mg Oral Daily   Continuous Infusions:  PRN Meds: acetaminophen, ondansetron (ZOFRAN) IV   Vital Signs    Vitals:   04/27/17 0745 04/27/17 2002 04/27/17 2345 04/28/17 0429  BP: 118/78 118/78 127/90 95/63  Pulse: 77 87 68 70  Resp: 14 (!) 23 15 13   Temp: 98 F (36.7 C) 98.2 F (36.8 C) 98.7 F (37.1 C) 98.1 F (36.7 C)  TempSrc: Oral Axillary Oral Oral  SpO2: 99% 95% 94% 91%  Weight:      Height:        Intake/Output Summary (Last 24 hours) at 04/28/2017 1249 Last data filed at 04/27/2017 1738 Gross per 24 hour  Intake -  Output 650 ml  Net -650 ml   Filed Weights   04/25/17 2239 04/26/17 0500  Weight: 218 lb (98.9 kg) 220 lb 3.8 oz (99.9 kg)    Telemetry    Sinus rhythm- Personally Reviewed  ECG    none - Personally Reviewed  Physical Exam  Well developed and nourished in no acute distress HENT normal Neck supple with JVP-flat Clear Regular rate and rhythm, no murmurs or gallops Abd-soft with active BS No Clubbing cyanosis edema Skin-warm and dry A & Oriented  Grossly normal sensory and motor function   Labs    Chemistry Recent Labs  Lab 04/25/17 2251  NA 133*  K 3.4*  CL 96*  CO2 25  GLUCOSE 108*  BUN 12  CREATININE 1.00  CALCIUM 9.3  GFRNONAA >60  GFRAA >60  ANIONGAP 12     Hematology Recent Labs  Lab 04/25/17 2251  WBC 12.4*  RBC 4.89  HGB 15.5  HCT 44.9  MCV 91.8  MCH 31.7    MCHC 34.5  RDW 13.2  PLT 283    Cardiac EnzymesNo results for input(s): TROPONINI in the last 168 hours.  Recent Labs  Lab 04/25/17 2250  TROPIPOC 0.00     BNPNo results for input(s): BNP, PROBNP in the last 168 hours.   DDimer No results for input(s): DDIMER in the last 168 hours.   Radiology    Mr Jeri Cos Wo Contrast  Result Date: 04/27/2017 CLINICAL DATA:  Seizure.  Syncope EXAM: MRI HEAD WITHOUT AND WITH CONTRAST TECHNIQUE: Multiplanar, multiecho pulse sequences of the brain and surrounding structures were obtained without and with intravenous contrast. CONTRAST:  60mL MULTIHANCE GADOBENATE DIMEGLUMINE 529 MG/ML IV SOLN COMPARISON:  None. FINDINGS: Brain: No acute infarction, hemorrhage, hydrocephalus, extra-axial collection or mass lesion. Vascular: Normal flow voids. Skull and upper cervical spine: Negative Sinuses/Orbits: Negative orbit. Mild mucosal edema paranasal sinuses Other: None IMPRESSION: Normal MRI brain with contrast Mild mucosal edema paranasal sinuses Electronically Signed   By: Franchot Gallo M.D.   On: 04/27/2017 07:36    Cardiac Studies     Device Interrogation        Assessment & Plan    Spells-sinus node dysfunction-hyper vagotonia Initial EEG  is negative.  Read the note from neurology, not sure dj vu.  Will await repeat EEG and then will need to discuss with neurology in the morning next  Have reviewed with him and his wife thoughts and the possibility that we may not have a definitive answer prior to making a discharge disposition    signed, Virl Axe, MD  04/28/2017, 12:49 PM

## 2017-04-28 NOTE — Procedures (Signed)
LTM-EEG Report  HISTORY: Continuous video-EEG monitoring performed for 52 year old with recurrent syncope.   ACQUISITION: International 10-20 system for electrode placement; 18 channels with additional eyes linked to ipsilateral ears and EKG. Additional T1-T2 electrodes were used. Continuous video recording obtained.   EEG NUMBER:  MEDICATIONS:  Day 1: No AED  DAY #1: from 1223 04/27/17 to 0730 04/28/17  BACKGROUND: An overall medium voltage continuous recording with good spontaneous variability and reactivity. Waking background consisted of a medium voltage 10 Hz posterior dominant rhythm bilaterally with low voltage beta activity in the bilateral frontocentral regions and some medium voltage theta activity diffusely. Sleep was captured with normal stage II sleep architecture.  EPILEPTIFORM/PERIODIC ACTIVITY: none SEIZURES: none EVENTS: none  EKG: no significant arrhythmia  SUMMARY: This was a normal continuous video EEG with no epileptiform discharges or lateralizing signs. Clinical events of interest were not captured.

## 2017-04-29 LAB — BASIC METABOLIC PANEL
Anion gap: 12 (ref 5–15)
BUN: 11 mg/dL (ref 6–20)
CO2: 23 mmol/L (ref 22–32)
Calcium: 9.2 mg/dL (ref 8.9–10.3)
Chloride: 103 mmol/L (ref 101–111)
Creatinine, Ser: 1.09 mg/dL (ref 0.61–1.24)
GFR calc Af Amer: >60 mL/min (ref >60)
GFR calc non Af Amer: >60 mL/min (ref >60)
Glucose, Bld: 100 mg/dL — ABNORMAL HIGH (ref 65–99)
Potassium: 4.2 mmol/L (ref 3.5–5.1)
Sodium: 138 mmol/L (ref 135–145)

## 2017-04-29 LAB — MAGNESIUM: Magnesium: 2 mg/dL (ref 1.7–2.4)

## 2017-04-29 NOTE — Progress Notes (Signed)
Pt informed and agreed to discharge. IV removed with catheter intact. Pt alert and oriented, vital signs stable. Went over discharged instructions with pt and pt signed AVS. Pt reported cell phone was only possession he had with him at hospital. Wife brought pt clothes and shoes to bedside. Pt refused wheelchair and walked off unit holding cell phone, with AVS, and with wife and nurse tech.

## 2017-04-29 NOTE — Progress Notes (Signed)
LTM EEG discontinued. No skin breakdown noted. 

## 2017-04-29 NOTE — Progress Notes (Signed)
Neurology Progress Note   S:// Seen and examined.  No acute events overnight. Long-term EEG overnight shows no abnormality.   O:// Current vital signs: BP 107/76   Pulse 72   Temp 98.2 F (36.8 C) (Oral)   Resp 13   Ht 6\' 2"  (1.88 m)   Wt 99.9 kg (220 lb 3.8 oz)   SpO2 93%   BMI 28.28 kg/m  Vital signs in last 24 hours: Temp:  [98 F (36.7 C)-98.5 F (36.9 C)] 98.2 F (36.8 C) (02/18 0804) Pulse Rate:  [72-111] 72 (02/18 0804) Resp:  [13-18] 13 (02/18 0804) BP: (106-124)/(54-91) 107/76 (02/18 0804) SpO2:  [92 %-95 %] 93 % (02/18 0804) GENERAL: Awake, alert in NAD HEENT: - Normocephalic and atraumatic, dry mm, no LN++, no Thyromegally LUNGS - Clear to auscultation bilaterally with no wheezes CV - S1S2 RRR, no m/r/g, equal pulses bilaterally. ABDOMEN - Soft, nontender, nondistended with normoactive BS Ext: warm, well perfused, intact peripheral pulses NEURO:  Mental Status: AA&Ox3 Language: speech is not dysarthric.  Naming, repetition, fluency, and comprehension intact. Cranial Nerves: PERRL . EOMI, visual fields full, no facial asymmetry,facial sensation intact, hearing intact, tongue/uvula/soft palate midline, normal sternocleidomastoid and trapezius muscle strength. No evidence of tongue atrophy or fibrillations Motor: 5/5 all over Tone: is normal and bulk is normal Sensation- Intact to light touch bilaterally Coordination: FTN intact bilaterally, no ataxia in BLE. Gait- deferred  Medications  Current Facility-Administered Medications:  .  acetaminophen (TYLENOL) tablet 650 mg, 650 mg, Oral, Q4H PRN, Chakravartti, Jaidip, MD .  atorvastatin (LIPITOR) tablet 20 mg, 20 mg, Oral, Daily, Chakravartti, Jaidip, MD, 20 mg at 04/28/17 1024 .  DULoxetine (CYMBALTA) DR capsule 60 mg, 60 mg, Oral, Daily, Chakravartti, Jaidip, MD, 60 mg at 04/28/17 1024 .  ondansetron (ZOFRAN) injection 4 mg, 4 mg, Intravenous, Q6H PRN, Chakravartti, Jaidip, MD .  pantoprazole (PROTONIX) EC  tablet 40 mg, 40 mg, Oral, Daily, Chakravartti, Jaidip, MD, 40 mg at 04/28/17 1024 Labs CBC    Component Value Date/Time   WBC 12.4 (H) 04/25/2017 2251   RBC 4.89 04/25/2017 2251   HGB 15.5 04/25/2017 2251   HCT 44.9 04/25/2017 2251   PLT 283 04/25/2017 2251   MCV 91.8 04/25/2017 2251   MCH 31.7 04/25/2017 2251   MCHC 34.5 04/25/2017 2251   RDW 13.2 04/25/2017 2251   LYMPHSABS 1.7 10/03/2013 1230   MONOABS 0.7 10/03/2013 1230   EOSABS 0.3 10/03/2013 1230   BASOSABS 0.0 10/03/2013 1230    CMP     Component Value Date/Time   NA 138 04/29/2017 0507   K 4.2 04/29/2017 0507   CL 103 04/29/2017 0507   CO2 23 04/29/2017 0507   GLUCOSE 100 (H) 04/29/2017 0507   BUN 11 04/29/2017 0507   CREATININE 1.09 04/29/2017 0507   CALCIUM 9.2 04/29/2017 0507   PROT 7.2 10/03/2013 1230   ALBUMIN 3.9 10/03/2013 1230   AST 16 10/03/2013 1230   ALT 17 10/03/2013 1230   ALKPHOS 60 10/03/2013 1230   BILITOT 0.5 10/03/2013 1230   GFRNONAA >60 04/29/2017 0507   GFRAA >60 04/29/2017 0507   Imaging I have reviewed images in epic and the results pertinent to this consultation are: MRI examination of the brain revealed no acute abnormalities.  Assessment:  52 year old man admitted to the ED with several episodes of syncope since Wednesday, 04/24/2017.  No seizure activity, muscle twitching, postictal state, loss of bowel bladder or extremity weakness. Being evaluated by cardiology, when he reported an  aura of lightheadedness, feeling flushed, diaphoretic clammy with a sensation of dj vu recalling various incidents that happened to him several years ago prior to these episodes. Given the stereotypic nature of these events, neurological consultation was placed for possible seizure etiology for these events. Continuous video EEG ordered.  A 48 hours has been unremarkable for any seizure activity. Telemetry during these episodes showed junctional rhythm but heart rates in the 20s. Most likely  underlying etiology is bradycardia/cerebral hypoperfusion.   Impression: --Abnormal episodes concerning for seizures in the setting of syncope --Normal video EEG for 48h of monitoring. Could still be temporal lobe seizure eminating from deep cortical regions that are difficult to pick with surface EEG leads.  Recommendations: No antiepileptics for now. Follow-up with outpatient neurology for potential ambulatory EEG as an outpatient with the hope of capturing these events for further characterization. No further recommendations from neurology at this time.  -- Amie Portland, MD [facility] Pager: [phone] If 7pm to 7am, please call on call as listed on AMION.

## 2017-04-29 NOTE — Plan of Care (Signed)
Pt informed and agreed to discharge. Pt has no additional questions or concerns. Pt awaiting on wife to bring back clothes and shoes to bedside.

## 2017-04-29 NOTE — Procedures (Signed)
LTM-EEG Report  HISTORY: Continuous video-EEG monitoring performed for 52 year old with recurrent syncope.   ACQUISITION: International 10-20 system for electrode placement; 18 channels with additional eyes linked to ipsilateral ears and EKG. Additional T1-T2 electrodes were used. Continuous video recording obtained.   EEG NUMBER:  MEDICATIONS:  Day 1: No AED Day 2: no AED  DAY #1: from 1223 04/27/17 to 0730 04/28/17  BACKGROUND: An overall medium voltage continuous recording with good spontaneous variability and reactivity. Waking background consisted of a medium voltage 10 Hz posterior dominant rhythm bilaterally with low voltage beta activity in the bilateral frontocentral regions and some medium voltage theta activity diffusely. Sleep was captured with normal stage II sleep architecture.  EPILEPTIFORM/PERIODIC ACTIVITY: none SEIZURES: none EVENTS: none  DAY #2: from 0730 04/28/17 to 0730 04/29/17  BACKGROUND: An overall medium voltage continuous recording with good spontaneous variability and reactivity. Waking background consisted of a medium voltage 10 Hz posterior dominant rhythm bilaterally with low voltage beta activity in the bilateral frontocentral regions and some medium voltage theta activity diffusely. Sleep was captured with normal stage II sleep architecture.  EPILEPTIFORM/PERIODIC ACTIVITY: none SEIZURES: none EVENTS: none  EKG: no significant arrhythmia  SUMMARY: This was a normal continuous video EEG with no epileptiform discharges or lateralizing signs. Clinical events of interest were not captured.

## 2017-04-29 NOTE — Discharge Summary (Signed)
ELECTROPHYSIOLOGY PROCEDURE DISCHARGE SUMMARY    Patient ID: Dylan Stewart,  MRN: [id number], DOB/AGE: [birth year] 52 y.o.  Admit date: 04/25/2017 Discharge date: 04/29/2017  Primary Care Physician: Crist Infante, MD  Electrophysiologist: Dr. Caryl Comes  Primary Discharge Diagnosis:  1. Syncope 2. Sinus pauses, bradycardia 3. Possible seizure  Secondary Discharge Diagnosis:  None  Allergies  Allergen Reactions  . Morphine And Related Nausea Only     Procedures This Admission:  none  Brief HPI: Dylan Stewart is a 52 y.o. male  with a hx of anxiety, HLD, GERD, and hx of prior syncope, undiagnosed though suspeted to be vagally mediated admitted to [facility] with recurrent near syncope/syncope, observed to have sinus pauses and bradycardia with HR 20's-30's.    Hospital Course:  The patient was admitted to stepdown and monitored closely on telemetry.  initially planned to undergo PPM, though after more discussion the patient reported a prodrome prior to his events of visual things, seeing things from the past, almost like a dj vu.  This prompted a neurological evaluation as possible primary seizure.  Brain MRI was normal, EEGs were done and normal.  Echo noted LVEF 55-60%, no WMA or significant VHD.  Telemetry monitoring throughout his stay did not have any recurrent bradycadic events and he had no further symptoms  Neurology thoughts noted abnormal episodes concerning for seizures in the setting of syncope.  Normal video EEG for 48h of monitoring. Could still be temporal lobe seizure eminating from deep cortical regions that are difficult to pick with surface EEG leads.  Recommended no antiepileptics for now. Follow-up with outpatient neurology for potential ambulatory EEG as an outpatient with the hope of capturing these events for further characterization.  Dr. Caryl Comes, in discussion with the patient's PMD, they will make referral to seizure/epilepsy specialist.  Once this is  completed, to follow up with Dr. Caryl Comes.  The patient was examined by Dr. Caryl Comes and considered stable for discharge to home.   The patient was made aware of [place] law, no driving for 6 months or until cleared.   Physical Exam: Vitals:   04/29/17 0408 04/29/17 0804 04/29/17 0900 04/29/17 1215  BP: 117/87 107/76  104/72  Pulse: 74 72 70 80  Resp: 13 13 16 14   Temp: 98.1 F (36.7 C) 98.2 F (36.8 C)  98 F (36.7 C)  TempSrc: Oral Oral  Oral  SpO2: 95% 93%  93%  Weight:      Height:        GEN- The patient is well appearing, alert and oriented x 3 today.   HEENT: normocephalic, atraumatic; sclera clear, conjunctiva pink; hearing intact; oropharynx clear; neck supple, no JVP Lungs- CTA b/l, normal work of breathing.  No wheezes, rales, rhonchi Heart-  RRR, no murmurs, rubs or gallops, PMI not laterally displaced GI- soft, non-tender, non-distended Extremities- no clubbing, cyanosis, or edema MS- no significant deformity or atrophy Skin- warm and dry, no rash or lesion Psych- euthymic mood, full affect Neuro- no gross deficits   Labs:   Lab Results  Component Value Date   WBC 12.4 (H) 04/25/2017   HGB 15.5 04/25/2017   HCT 44.9 04/25/2017   MCV 91.8 04/25/2017   PLT 283 04/25/2017    Recent Labs  Lab 04/29/17 0507  NA 138  K 4.2  CL 103  CO2 23  BUN 11  CREATININE 1.09  CALCIUM 9.2  GLUCOSE 100*    Discharge Medications:  Allergies as of 04/29/2017  Reactions   Morphine And Related Nausea Only      Medication List    TAKE these medications   acetaminophen 500 MG tablet Commonly known as:  TYLENOL Take 1,000 mg by mouth every 6 (six) hours as needed for pain.   atorvastatin 20 MG tablet Commonly known as:  LIPITOR Take 20 mg by mouth daily.   cetirizine 10 MG tablet Commonly known as:  ZYRTEC Take 10 mg by mouth as needed for allergies.   DULoxetine 60 MG capsule Commonly known as:  CYMBALTA Take 60 mg by mouth daily.   esomeprazole 40 MG  capsule Commonly known as:  NEXIUM Take 40 mg by mouth daily before breakfast.       Disposition:  Home Discharge Instructions    Diet - low sodium heart healthy   Complete by:  As directed    Increase activity slowly   Complete by:  As directed      Follow-up Information    Crist Infante, MD Follow up.   Specialty:  Internal Medicine Why:  Please follow up regarding referral to [facility] neurology Contact information: [place] [address] [phone]        Deboraha Sprang, MD Follow up.   Specialty:  Cardiology Why:  call for follow up once neurology follow-up/evaluation is completed Contact information: [address] [address] [phone]           Duration of Discharge Encounter: Greater than 30 minutes including physician time.  Venetia Night, PA-C 04/29/2017 3:49 PM

## 2017-04-29 NOTE — Discharge Instructions (Signed)
NO DRIVING 6 MONTHS OR UNTIL CLEARED TO

## 2017-04-29 NOTE — Care Management Note (Signed)
Case Management Note  Patient Details  Name: Dylan Stewart MRN: [id number] Date of Birth: 08/29/1965  Subjective/Objective:    From home with wife, pta indep, admitted with syncope and found to have hyper vagotonia sinus arrest.                    Action/Plan: DC home , no needs.   Expected Discharge Date:  04/29/17               Expected Discharge Plan:  Home/Self Care  In-House Referral:     Discharge planning Services  CM Consult  Post Acute Care Choice:    Choice offered to:     DME Arranged:    DME Agency:     HH Arranged:    HH Agency:     Status of Service:  Completed, signed off  If discussed at [employer] of Stay Meetings, dates discussed:    Additional Comments:  Zenon Mayo, RN 04/29/2017, 4:40 PM

## 2017-05-03 ENCOUNTER — Encounter: Payer: Self-pay | Admitting: Neurology

## 2017-05-06 DIAGNOSIS — Z6828 Body mass index (BMI) 28.0-28.9, adult: Secondary | ICD-10-CM | POA: Diagnosis not present

## 2017-05-06 DIAGNOSIS — Z0000 Encounter for general adult medical examination without abnormal findings: Secondary | ICD-10-CM | POA: Diagnosis not present

## 2017-05-06 DIAGNOSIS — R4020 Unspecified coma: Secondary | ICD-10-CM | POA: Diagnosis not present

## 2017-05-06 DIAGNOSIS — D7589 Other specified diseases of blood and blood-forming organs: Secondary | ICD-10-CM | POA: Diagnosis not present

## 2017-05-06 DIAGNOSIS — K649 Unspecified hemorrhoids: Secondary | ICD-10-CM | POA: Diagnosis not present

## 2017-05-06 DIAGNOSIS — Z1389 Encounter for screening for other disorder: Secondary | ICD-10-CM | POA: Diagnosis not present

## 2017-05-06 DIAGNOSIS — Z23 Encounter for immunization: Secondary | ICD-10-CM | POA: Diagnosis not present

## 2017-05-06 DIAGNOSIS — R55 Syncope and collapse: Secondary | ICD-10-CM | POA: Diagnosis not present

## 2017-05-15 ENCOUNTER — Other Ambulatory Visit: Payer: Self-pay | Admitting: Internal Medicine

## 2017-05-15 ENCOUNTER — Ambulatory Visit (INDEPENDENT_AMBULATORY_CARE_PROVIDER_SITE_OTHER): Payer: BLUE CROSS/BLUE SHIELD

## 2017-05-15 DIAGNOSIS — R55 Syncope and collapse: Secondary | ICD-10-CM

## 2017-05-21 ENCOUNTER — Encounter: Payer: Self-pay | Admitting: Neurology

## 2017-05-21 ENCOUNTER — Ambulatory Visit (INDEPENDENT_AMBULATORY_CARE_PROVIDER_SITE_OTHER): Payer: BLUE CROSS/BLUE SHIELD | Admitting: Neurology

## 2017-05-21 VITALS — BP 114/80 | HR 91 | Ht 73.5 in | Wt 221.1 lb

## 2017-05-21 DIAGNOSIS — R55 Syncope and collapse: Secondary | ICD-10-CM
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
## 2017-05-21 NOTE — Patient Instructions (Signed)
1. Let us know about your decision regarding the 72-hour EEG 2. Proceed with visit at [facility] on Monday 3. As per [place] driving laws, no driving after an episode of loss of consciousness, until 6 months event-free 4. Follow-up in 4-5 months, call for any changes

## 2017-05-23 DIAGNOSIS — G40909 Epilepsy, unspecified, not intractable, without status epilepticus: Secondary | ICD-10-CM | POA: Insufficient documentation

## 2017-05-27 ENCOUNTER — Encounter: Payer: Self-pay | Admitting: Neurology

## 2017-05-27 DIAGNOSIS — R55 Syncope and collapse: Secondary | ICD-10-CM | POA: Diagnosis not present

## 2017-05-27 DIAGNOSIS — G40909 Epilepsy, unspecified, not intractable, without status epilepticus: Secondary | ICD-10-CM | POA: Diagnosis not present

## 2017-08-19 DIAGNOSIS — L821 Other seborrheic keratosis: Secondary | ICD-10-CM | POA: Diagnosis not present

## 2017-08-19 DIAGNOSIS — D229 Melanocytic nevi, unspecified: Secondary | ICD-10-CM | POA: Diagnosis not present

## 2017-08-19 DIAGNOSIS — L814 Other melanin hyperpigmentation: Secondary | ICD-10-CM | POA: Diagnosis not present

## 2017-08-19 DIAGNOSIS — D1801 Hemangioma of skin and subcutaneous tissue: Secondary | ICD-10-CM | POA: Diagnosis not present

## 2017-09-19 ENCOUNTER — Encounter: Payer: Self-pay | Admitting: Neurology

## 2017-09-19 ENCOUNTER — Ambulatory Visit (INDEPENDENT_AMBULATORY_CARE_PROVIDER_SITE_OTHER): Payer: BLUE CROSS/BLUE SHIELD | Admitting: Neurology

## 2017-09-19 ENCOUNTER — Other Ambulatory Visit: Payer: Self-pay

## 2017-09-19 VITALS — BP 114/80 | HR 87 | Ht 74.0 in | Wt 220.0 lb

## 2017-09-19 DIAGNOSIS — R55 Syncope and collapse: Secondary | ICD-10-CM
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
## 2017-09-19 NOTE — Patient Instructions (Signed)
1. Discuss Tilt-table testing with Dr. Caryl Comes 2. Follow-up in 1 year, call for any changes

## 2017-11-04 DIAGNOSIS — R0602 Shortness of breath: Secondary | ICD-10-CM | POA: Diagnosis not present

## 2017-11-04 DIAGNOSIS — R4020 Unspecified coma: Secondary | ICD-10-CM | POA: Diagnosis not present

## 2017-11-04 DIAGNOSIS — J3089 Other allergic rhinitis: Secondary | ICD-10-CM | POA: Diagnosis not present

## 2017-11-04 DIAGNOSIS — Z23 Encounter for immunization: Secondary | ICD-10-CM | POA: Diagnosis not present

## 2017-11-19 ENCOUNTER — Encounter: Payer: Self-pay | Admitting: Internal Medicine

## 2017-11-19 ENCOUNTER — Ambulatory Visit (INDEPENDENT_AMBULATORY_CARE_PROVIDER_SITE_OTHER): Payer: BLUE CROSS/BLUE SHIELD | Admitting: Internal Medicine

## 2017-11-19 VITALS — BP 141/88 | HR 86 | Ht 74.0 in | Wt 223.0 lb

## 2017-11-19 DIAGNOSIS — R55 Syncope and collapse: Secondary | ICD-10-CM | POA: Diagnosis not present

## 2017-11-19 NOTE — Progress Notes (Signed)
Patient Care Team: Crist Infante, MD as PCP - General (Internal Medicine)   HPI  Dylan Stewart is a 52 y.o. male Seen in followup for syncope with PP prolongation   Has had recurrent events in 2009, 2014, 2017 and 2019; the latter events began with a prodrome of warmth and weakness and fainting in a chair.  Later that evening he fainted again and was brought to the emergency room where he had a 5 or 6 presyncopal episodes concurrent with sinus slowing and PP prolongation.  These occurred while lying down.  1 of his events has occurred on the commode.  Another event occurred in the warm environment.  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  This prompted a decision not to proceed with pacing but to refer for neurological evaluation.  The notes from the [facility] were read as well as from [facility] neurology.  The former was quite dismissive of a neurological event the latter were less so with a suggestion of empiric Keppra  The [facility] note had suggested a tilt table test.  He is here to discuss this today.  No intercurrent events. Records and Results Reviewed As above   Past Medical History:  Diagnosis Date  . Anxiety   . GERD (gastroesophageal reflux disease)   . Hyperlipidemia     Past Surgical History:  Procedure Laterality Date  . APPENDECTOMY    . NASAL SEPTUM SURGERY  2000  . UPPER GASTROINTESTINAL ENDOSCOPY    . WISDOM TOOTH EXTRACTION      Current Meds  Medication Sig  . acetaminophen (TYLENOL) 500 MG tablet Take 1,000 mg by mouth every 6 (six) hours as needed for pain.   . cetirizine (ZYRTEC) 10 MG tablet Take 10 mg by mouth as needed for allergies.  . DULoxetine (CYMBALTA) 60 MG capsule Take 60 mg by mouth daily.  Marland Kitchen esomeprazole (NEXIUM) 40 MG capsule Take 40 mg by mouth daily before breakfast.    Allergies  Allergen Reactions  . Morphine And Related Nausea Only      Review of  Systems negative except from HPI and PMH  Physical Exam BP (!) 141/88   Pulse 86   Ht 6\' 2"  (1.88 m)   Wt 223 lb (101.2 kg)   SpO2 96%   BMI 28.63 kg/m  Well developed and well nourished in no acute distress HENT normal E scleral and icterus clear Neck Supple JVP flat; carotids brisk and full Clear to ausculation Regular rate and rhythm, no murmurs gallops or rub Soft with active bowel sounds No clubbing cyanosis Edema Alert and oriented, grossly normal motor and sensory function Skin Warm and Dry  ECG demonstrated sinus rhythm at 86 Interval 14/08/36 T wave flattening  Assessment and  Plan  Syncope question neurologic versus cardiac versus reflex    The patient spells remain unexplained.  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  This unfortunately however does not specify a trigger and the dj vu remains a concern as a manifestation of temporal lobe epilepsy as the cause of the event.  We discussed the physiology of a non-neurological event that would be consistent with vasovagal syncope.  I am not sure tilt table testing helps.  In the event that it were negative, clearly uninformative.  Its poor specificity particularly in light of the prodrome with dj vu I think makes a positive test also unhelpful unclarifying a diagnosis  For now,  have suggested that he be particularly attuned to the prodrome.  At that time he become recumbent and realize it may take 30 minutes to an hour for the symptoms to abate.  Premature standing may result in syncope.  More than 50% of 45 min was spent in counseling related to the above  Discussed with Perini    Current medicines are reviewed at length with the patient today .  The patient does not  have concerns regarding medicines.

## 2017-11-19 NOTE — Patient Instructions (Signed)

## 2017-11-20 ENCOUNTER — Ambulatory Visit: Payer: BLUE CROSS/BLUE SHIELD | Admitting: Internal Medicine

## 2017-11-25 ENCOUNTER — Emergency Department (HOSPITAL_COMMUNITY)
Admission: EM | Admit: 2017-11-25 | Discharge: 2017-11-25 | Disposition: A | Discharge status: Home / Self Care | Payer: BLUE CROSS/BLUE SHIELD | Attending: Emergency Medicine | Admitting: Emergency Medicine

## 2017-11-25 ENCOUNTER — Encounter (HOSPITAL_COMMUNITY): Payer: Self-pay | Admitting: Emergency Medicine

## 2017-11-25 ENCOUNTER — Emergency Department (HOSPITAL_COMMUNITY): Payer: BLUE CROSS/BLUE SHIELD

## 2017-11-25 DIAGNOSIS — R1030 Lower abdominal pain, unspecified: Secondary | ICD-10-CM | POA: Diagnosis not present

## 2017-11-25 DIAGNOSIS — E860 Dehydration: Secondary | ICD-10-CM | POA: Diagnosis not present

## 2017-11-25 DIAGNOSIS — R140 Abdominal distension (gaseous): Secondary | ICD-10-CM | POA: Diagnosis not present

## 2017-11-25 DIAGNOSIS — K4090 Unilateral inguinal hernia, without obstruction or gangrene, not specified as recurrent: Secondary | ICD-10-CM | POA: Diagnosis not present

## 2017-11-25 DIAGNOSIS — Z79899 Other long term (current) drug therapy: Secondary | ICD-10-CM | POA: Diagnosis not present

## 2017-11-25 DIAGNOSIS — R109 Unspecified abdominal pain: Secondary | ICD-10-CM | POA: Insufficient documentation

## 2017-11-25 DIAGNOSIS — R1084 Generalized abdominal pain: Secondary | ICD-10-CM | POA: Diagnosis not present

## 2017-11-25 LAB — COMPREHENSIVE METABOLIC PANEL
ALT: 23 U/L (ref 0–44)
AST: 24 U/L (ref 15–41)
Albumin: 4 g/dL (ref 3.5–5.0)
Alkaline Phosphatase: 57 U/L (ref 38–126)
Anion gap: 11 (ref 5–15)
BUN: 17 mg/dL (ref 6–20)
CO2: 25 mmol/L (ref 22–32)
Calcium: 9.6 mg/dL (ref 8.9–10.3)
Chloride: 103 mmol/L (ref 98–111)
Creatinine, Ser: 1.15 mg/dL (ref 0.61–1.24)
GFR calc Af Amer: >60 mL/min (ref >60)
GFR calc non Af Amer: >60 mL/min (ref >60)
Glucose, Bld: 123 mg/dL — ABNORMAL HIGH (ref 70–99)
Potassium: 3.7 mmol/L (ref 3.5–5.1)
Sodium: 139 mmol/L (ref 135–145)
Total Bilirubin: 0.6 mg/dL (ref 0.3–1.2)
Total Protein: 6.6 g/dL (ref 6.5–8.1)

## 2017-11-25 LAB — LIPASE, BLOOD: Lipase: 29 U/L (ref 11–51)

## 2017-11-25 LAB — URINALYSIS, ROUTINE W REFLEX MICROSCOPIC
Bilirubin Urine: NEGATIVE
Glucose, UA: NEGATIVE mg/dL
Hgb urine dipstick: NEGATIVE
Ketones, ur: NEGATIVE mg/dL
Leukocytes, UA: NEGATIVE
Nitrite: NEGATIVE
Protein, ur: NEGATIVE mg/dL
Specific Gravity, Urine: 1.043 — ABNORMAL HIGH (ref 1.005–1.030)
pH: 6 (ref 5.0–8.0)

## 2017-11-25 LAB — CBC WITH DIFFERENTIAL/PLATELET
Abs Immature Granulocytes: 0.1 10*3/uL (ref 0.0–0.1)
Basophils Absolute: 0.1 10*3/uL (ref 0.0–0.1)
Basophils Relative: 0 %
Eosinophils Absolute: 0.2 10*3/uL (ref 0.0–0.7)
Eosinophils Relative: 2 %
HCT: 44.4 % (ref 39.0–52.0)
Hemoglobin: 15 g/dL (ref 13.0–17.0)
Immature Granulocytes: 1 %
Lymphocytes Relative: 13 %
Lymphs Abs: 1.8 10*3/uL (ref 0.7–4.0)
MCH: 31 pg (ref 26.0–34.0)
MCHC: 33.8 g/dL (ref 30.0–36.0)
MCV: 91.7 fL (ref 78.0–100.0)
Monocytes Absolute: 1.2 10*3/uL — ABNORMAL HIGH (ref 0.1–1.0)
Monocytes Relative: 9 %
Neutro Abs: 10.2 10*3/uL — ABNORMAL HIGH (ref 1.7–7.7)
Neutrophils Relative %: 75 %
Platelets: 266 10*3/uL (ref 150–400)
RBC: 4.84 MIL/uL (ref 4.22–5.81)
RDW: 12.6 % (ref 11.5–15.5)
WBC: 13.6 10*3/uL — ABNORMAL HIGH (ref 4.0–10.5)

## 2017-11-25 MED ORDER — FENTANYL CITRATE (PF) 100 MCG/2ML IJ SOLN
50.0000 ug | Freq: Once | INTRAMUSCULAR | Status: DC
  Filled 2017-11-25: qty 2

## 2017-11-25 MED ORDER — ONDANSETRON HCL 4 MG/2ML IJ SOLN
4.0000 mg | Freq: Once | INTRAMUSCULAR | Status: DC
  Filled 2017-11-25: qty 2

## 2017-11-25 MED ORDER — IOHEXOL 300 MG/ML  SOLN
100.0000 mL | Freq: Once | INTRAMUSCULAR | Status: AC | PRN
  Administered 2017-11-25: 100 mL via INTRAVENOUS

## 2017-11-25 MED ORDER — SODIUM CHLORIDE 0.9 % IV BOLUS (SEPSIS)
1000.0000 mL | Freq: Once | INTRAVENOUS | Status: AC
  Administered 2017-11-25: 1000 mL via INTRAVENOUS

## 2017-11-25 NOTE — ED Triage Notes (Signed)
Pt transported from home by EMS for c/o lower abd pain onset this evening, constant with sharp intermittent pain.  Denies n/v/d.  Pt has not taken any home meds for pain

## 2017-11-25 NOTE — ED Notes (Signed)
Pt declined need for pain or nausea medication at this time.

## 2017-11-25 NOTE — ED Provider Notes (Addendum)
TIME SEEN: 2:30 AM  CHIEF COMPLAINT: Abdominal pain  HPI: Patient is a 52 year old male with history of hyperlipidemia who presents the emergency department with lower abdominal pain that woke him from sleep around midnight.  States he was feeling fine before he went to bed and then woke up with crampy abdominal pain, feeling sweaty and wife reports he appeared pale.  No nausea, vomiting, diarrhea.  No bloody stool or melena.  No dysuria or hematuria.  No history of kidney stones.  Has had previous appendectomy.  States symptoms slowly feeling better after having a bowel movement here in the emergency department but not completely gone.  No sick contacts or bad food exposure.  No recent international travel.  ROS: See HPI Constitutional: no fever  Eyes: no drainage  ENT: no runny nose   Cardiovascular:  no chest pain  Resp: no SOB  GI: no vomiting GU: no dysuria Integumentary: no rash  Allergy: no hives  Musculoskeletal: no leg swelling  Neurological: no slurred speech ROS otherwise negative  PAST MEDICAL HISTORY/PAST SURGICAL HISTORY:  Past Medical History:  Diagnosis Date  . Anxiety   . GERD (gastroesophageal reflux disease)   . Hyperlipidemia     MEDICATIONS:  Prior to Admission medications   Medication Sig Start Date End Date Taking? Authorizing Provider  acetaminophen (TYLENOL) 500 MG tablet Take 1,000 mg by mouth every 6 (six) hours as needed for pain.     [provider]  cetirizine (ZYRTEC) 10 MG tablet Take 10 mg by mouth as needed for allergies.    [provider]  DULoxetine (CYMBALTA) 60 MG capsule Take 60 mg by mouth daily.    [provider]  esomeprazole (NEXIUM) 40 MG capsule Take 40 mg by mouth daily before breakfast.    [provider]    ALLERGIES:  Allergies  Allergen Reactions  . Morphine And Related Nausea Only    SOCIAL HISTORY:  Social History   Tobacco Use  . Smoking status: Never Smoker  . Smokeless tobacco:  Never Used  Substance Use Topics  . Alcohol use: Yes    Comment: socially    FAMILY HISTORY: Family History  Problem Relation Age of Onset  . Pancreatic cancer Mother   . Diabetes Paternal Grandfather   . Diabetes Father     EXAM: BP (!) 105/54 (BP Location: Left Arm)   Pulse 90   Temp 98.2 F (36.8 C) (Oral)   Resp 14   Ht 6\' 1"  (1.854 m)   Wt 100.2 kg   SpO2 96%   BMI 29.16 kg/m  CONSTITUTIONAL: Alert and oriented and responds appropriately to questions. Well-appearing; well-nourished HEAD: Normocephalic EYES: Conjunctivae clear, pupils appear equal, EOMI ENT: normal nose; moist mucous membranes NECK: Supple, no meningismus, no nuchal rigidity, no LAD  CARD: RRR; S1 and S2 appreciated; no murmurs, no clicks, no rubs, no gallops RESP: Normal chest excursion without splinting or tachypnea; breath sounds clear and equal bilaterally; no wheezes, no rhonchi, no rales, no hypoxia or respiratory distress, speaking full sentences ABD/GI: Normal bowel sounds; non-distended; soft, tender to palpation diffusely throughout the lower abdomen but especially in the left lower quadrant, no rebound, no guarding, no peritoneal signs, no hepatosplenomegaly BACK:  The back appears normal and is non-tender to palpation, there is no CVA tenderness EXT: Normal ROM in all joints; non-tender to palpation; no edema; normal capillary refill; no cyanosis, no calf tenderness or swelling    SKIN: Normal color for age and race; warm;  no rash NEURO: Moves all extremities equally PSYCH: The patient's mood and manner are appropriate. Grooming and personal hygiene are appropriate.  MEDICAL DECISION MAKING: Patient here with lower abdominal pain.  Differential diagnosis includes diverticulitis, colitis, kidney stone, bowel obstruction.  Will obtain labs, urine and a CT of the abdomen pelvis.  We will treat symptomatically with IV fluids, pain and nausea medicine.  ED PROGRESS: Work-up here has been  unremarkable.  Labs show mild leukocytosis with left shift but otherwise normal LFTs, lipase, creatinine.  Urine shows no sign of infection.  CT scan unremarkable.  Could be the beginning of viral illness.  Patient reports feeling better and able to tolerate p.o. fluids.  I feel he is safe to be discharged home.  I suspect that he had a near syncopal event and that this was vasovagal in nature.  Wife reports he has had vasovagal episodes in the past.   At this time, I do not feel there is any life-threatening condition present. I have reviewed and discussed all results (EKG, imaging, lab, urine as appropriate) and exam findings with patient/family. I have reviewed nursing notes and appropriate previous records.  I feel the patient is safe to be discharged home without further emergent workup and can continue workup as an outpatient as needed. Discussed usual and customary return precautions. Patient/family verbalize understanding and are comfortable with this plan.  Outpatient follow-up has been provided if needed. All questions have been answered.      Robyn Galati, Delice Bison, DO 11/25/17 [place], Delice Bison, DO 11/25/17 [phone]

## 2018-01-29 DIAGNOSIS — F411 Generalized anxiety disorder: Secondary | ICD-10-CM | POA: Diagnosis not present

## 2018-02-13 DIAGNOSIS — F411 Generalized anxiety disorder: Secondary | ICD-10-CM | POA: Diagnosis not present

## 2018-02-18 DIAGNOSIS — F411 Generalized anxiety disorder: Secondary | ICD-10-CM | POA: Diagnosis not present

## 2018-02-24 DIAGNOSIS — F411 Generalized anxiety disorder: Secondary | ICD-10-CM | POA: Diagnosis not present

## 2018-02-26 DIAGNOSIS — F411 Generalized anxiety disorder: Secondary | ICD-10-CM | POA: Diagnosis not present

## 2018-02-28 DIAGNOSIS — F411 Generalized anxiety disorder: Secondary | ICD-10-CM | POA: Diagnosis not present

## 2018-03-13 DIAGNOSIS — F411 Generalized anxiety disorder: Secondary | ICD-10-CM | POA: Diagnosis not present

## 2018-03-17 DIAGNOSIS — F411 Generalized anxiety disorder: Secondary | ICD-10-CM | POA: Diagnosis not present

## 2018-03-21 DIAGNOSIS — F411 Generalized anxiety disorder: Secondary | ICD-10-CM | POA: Diagnosis not present

## 2018-04-01 DIAGNOSIS — F411 Generalized anxiety disorder: Secondary | ICD-10-CM | POA: Diagnosis not present

## 2018-04-18 DIAGNOSIS — F411 Generalized anxiety disorder: Secondary | ICD-10-CM | POA: Diagnosis not present

## 2018-04-24 DIAGNOSIS — F411 Generalized anxiety disorder: Secondary | ICD-10-CM | POA: Diagnosis not present

## 2018-04-25 DIAGNOSIS — F411 Generalized anxiety disorder: Secondary | ICD-10-CM | POA: Diagnosis not present

## 2018-05-12 DIAGNOSIS — F411 Generalized anxiety disorder: Secondary | ICD-10-CM | POA: Diagnosis not present

## 2018-05-16 DIAGNOSIS — F411 Generalized anxiety disorder: Secondary | ICD-10-CM | POA: Diagnosis not present

## 2018-05-26 DIAGNOSIS — F411 Generalized anxiety disorder: Secondary | ICD-10-CM | POA: Diagnosis not present

## 2018-05-27 DIAGNOSIS — Z125 Encounter for screening for malignant neoplasm of prostate: Secondary | ICD-10-CM | POA: Diagnosis not present

## 2018-05-27 DIAGNOSIS — E7849 Other hyperlipidemia: Secondary | ICD-10-CM | POA: Diagnosis not present

## 2018-06-03 DIAGNOSIS — Z0000 Encounter for general adult medical examination without abnormal findings: Secondary | ICD-10-CM | POA: Diagnosis not present

## 2018-06-03 DIAGNOSIS — R0602 Shortness of breath: Secondary | ICD-10-CM | POA: Diagnosis not present

## 2018-06-03 DIAGNOSIS — D7589 Other specified diseases of blood and blood-forming organs: Secondary | ICD-10-CM | POA: Diagnosis not present

## 2018-06-03 DIAGNOSIS — D126 Benign neoplasm of colon, unspecified: Secondary | ICD-10-CM | POA: Diagnosis not present

## 2018-06-03 DIAGNOSIS — Z23 Encounter for immunization: Secondary | ICD-10-CM | POA: Diagnosis not present

## 2018-06-03 DIAGNOSIS — Z1331 Encounter for screening for depression: Secondary | ICD-10-CM | POA: Diagnosis not present

## 2018-06-03 DIAGNOSIS — E7849 Other hyperlipidemia: Secondary | ICD-10-CM | POA: Diagnosis not present

## 2018-06-06 DIAGNOSIS — F411 Generalized anxiety disorder: Secondary | ICD-10-CM | POA: Diagnosis not present

## 2018-06-09 ENCOUNTER — Other Ambulatory Visit: Payer: Self-pay | Admitting: Internal Medicine

## 2018-06-09 DIAGNOSIS — F411 Generalized anxiety disorder: Secondary | ICD-10-CM | POA: Diagnosis not present

## 2018-06-09 DIAGNOSIS — E785 Hyperlipidemia, unspecified: Secondary | ICD-10-CM

## 2018-06-20 DIAGNOSIS — F411 Generalized anxiety disorder: Secondary | ICD-10-CM | POA: Diagnosis not present

## 2018-06-23 DIAGNOSIS — F411 Generalized anxiety disorder: Secondary | ICD-10-CM | POA: Diagnosis not present

## 2018-07-04 DIAGNOSIS — F411 Generalized anxiety disorder: Secondary | ICD-10-CM | POA: Diagnosis not present

## 2018-07-07 DIAGNOSIS — F411 Generalized anxiety disorder: Secondary | ICD-10-CM | POA: Diagnosis not present

## 2018-07-21 DIAGNOSIS — F411 Generalized anxiety disorder: Secondary | ICD-10-CM | POA: Diagnosis not present

## 2018-07-31 ENCOUNTER — Other Ambulatory Visit: Payer: BLUE CROSS/BLUE SHIELD

## 2018-08-13 DIAGNOSIS — F411 Generalized anxiety disorder: Secondary | ICD-10-CM | POA: Diagnosis not present

## 2018-08-20 DIAGNOSIS — L918 Other hypertrophic disorders of the skin: Secondary | ICD-10-CM | POA: Diagnosis not present

## 2018-08-20 DIAGNOSIS — D225 Melanocytic nevi of trunk: Secondary | ICD-10-CM | POA: Diagnosis not present

## 2018-08-20 DIAGNOSIS — L820 Inflamed seborrheic keratosis: Secondary | ICD-10-CM | POA: Diagnosis not present

## 2018-08-20 DIAGNOSIS — D1801 Hemangioma of skin and subcutaneous tissue: Secondary | ICD-10-CM | POA: Diagnosis not present

## 2018-08-20 DIAGNOSIS — L814 Other melanin hyperpigmentation: Secondary | ICD-10-CM | POA: Diagnosis not present

## 2018-08-25 ENCOUNTER — Ambulatory Visit
Admission: RE | Admit: 2018-08-25 | Discharge: 2018-08-25 | Disposition: A | Discharge status: Home / Self Care | Payer: BLUE CROSS/BLUE SHIELD | Source: Ambulatory Visit | Attending: Internal Medicine | Admitting: Internal Medicine

## 2018-08-25 DIAGNOSIS — E785 Hyperlipidemia, unspecified: Secondary | ICD-10-CM

## 2018-08-25 DIAGNOSIS — M545 Low back pain: Secondary | ICD-10-CM | POA: Diagnosis not present

## 2018-09-02 DIAGNOSIS — F411 Generalized anxiety disorder: Secondary | ICD-10-CM | POA: Diagnosis not present

## 2018-09-12 DIAGNOSIS — F411 Generalized anxiety disorder: Secondary | ICD-10-CM | POA: Diagnosis not present

## 2018-09-16 ENCOUNTER — Other Ambulatory Visit: Payer: Self-pay | Admitting: Internal Medicine

## 2018-09-16 DIAGNOSIS — M545 Low back pain, unspecified: Secondary | ICD-10-CM

## 2018-09-17 ENCOUNTER — Other Ambulatory Visit: Payer: Self-pay

## 2018-09-17 ENCOUNTER — Ambulatory Visit
Admission: RE | Admit: 2018-09-17 | Discharge: 2018-09-17 | Disposition: A | Discharge status: Home / Self Care | Payer: No Typology Code available for payment source | Source: Ambulatory Visit | Attending: Internal Medicine | Admitting: Internal Medicine

## 2018-09-17 DIAGNOSIS — M545 Low back pain, unspecified: Secondary | ICD-10-CM

## 2018-09-19 ENCOUNTER — Ambulatory Visit: Payer: BLUE CROSS/BLUE SHIELD | Admitting: Neurology

## 2019-05-27 ENCOUNTER — Ambulatory Visit: Payer: No Typology Code available for payment source | Admitting: Neurology

## 2019-07-02 ENCOUNTER — Ambulatory Visit (AMBULATORY_SURGERY_CENTER): Payer: Self-pay | Admitting: *Deleted

## 2019-07-02 ENCOUNTER — Other Ambulatory Visit: Payer: Self-pay

## 2019-07-02 VITALS — Temp 96.0°F | Ht 74.0 in | Wt 218.0 lb

## 2019-07-02 DIAGNOSIS — Z86010 Personal history of colonic polyps: Secondary | ICD-10-CM

## 2019-07-02 MED ORDER — SUPREP BOWEL PREP KIT 17.5-3.13-1.6 GM/177ML PO SOLN: 1.0000 | Freq: Once | ORAL | 0 refills | Status: AC

## 2019-07-02 NOTE — Progress Notes (Signed)
COVID VACCINE TRIAL PT- COMPLETED COVID VBACCINES 12-10-2018  No egg or soy allergy known to patient  No issues with past sedation with any surgeries  or procedures, no intubation problems  No diet pills per patient No home 02 use per patient  No blood thinners per patient  Pt denies issues with constipation  No A fib or A flutter  EMMI video sent to pt's e mail   2 day Suprep due to adequate prep   Due to the COVID-19 pandemic we are asking patients to follow these guidelines. Please only bring one care partner. Please be aware that your care partner may wait in the car in the parking lot or if they feel like they will be too hot to wait in the car, they may wait in the lobby on the 4th floor. All care partners are required to wear a mask the entire time (we do not have any that we can provide them), they need to practice social distancing, and we will do a Covid check for all patient's and care partners when you arrive. Also we will check their temperature and your temperature. If the care partner waits in their car they need to stay in the parking lot the entire time and we will call them on their cell phone when the patient is ready for discharge so they can bring the car to the front of the building. Also all patient's will need to wear a mask into building.

## 2019-07-08 ENCOUNTER — Telehealth: Payer: Self-pay | Admitting: Gastroenterology

## 2019-07-08 NOTE — Telephone Encounter (Signed)
Pt called and cancelled appt for Monday 07/13/19 @ 11:30 w/Dr. Havery Moros. He rescheduled for July.

## 2019-07-10 ENCOUNTER — Encounter: Payer: No Typology Code available for payment source | Admitting: Gastroenterology

## 2019-07-13 ENCOUNTER — Encounter: Payer: No Typology Code available for payment source | Admitting: Gastroenterology

## 2019-10-05 ENCOUNTER — Encounter: Payer: No Typology Code available for payment source | Admitting: Gastroenterology

## 2019-10-09 ENCOUNTER — Encounter: Payer: Self-pay | Admitting: Gastroenterology

## 2019-11-09 ENCOUNTER — Encounter: Payer: Self-pay | Admitting: Gastroenterology

## 2019-11-09 ENCOUNTER — Ambulatory Visit (AMBULATORY_SURGERY_CENTER): Payer: Self-pay | Admitting: *Deleted

## 2019-11-09 ENCOUNTER — Other Ambulatory Visit: Payer: Self-pay

## 2019-11-09 VITALS — Ht 74.0 in | Wt 218.0 lb

## 2019-11-09 DIAGNOSIS — Z86010 Personal history of colonic polyps: Secondary | ICD-10-CM

## 2019-11-09 MED ORDER — NA SULFATE-K SULFATE-MG SULF 17.5-3.13-1.6 GM/177ML PO SOLN: ORAL | 0 refills | Status: DC

## 2019-11-09 NOTE — Progress Notes (Signed)
Patient is here in-person for PV. Patient denies any allergies to eggs or soy. Patient denies any problems with anesthesia/sedation. Patient denies any oxygen use at home. Patient denies taking any diet/weight loss medications or blood thinners. Patient is not being treated for MRSA or C-diff. Patient is aware of our care-partner policy and [id number] safety protocol.  COVID-19 vaccines completed 12/10/2018 and had booster 3 weeks ago per pt  Prep Prescription coupon given to the patient. Pt denies any medical changes in hx since last PV. Pt states he does not have seizure had fainting spells-last time 04/2017.

## 2019-11-20 ENCOUNTER — Encounter: Payer: Self-pay | Admitting: Gastroenterology

## 2019-11-20 ENCOUNTER — Ambulatory Visit (AMBULATORY_SURGERY_CENTER): Payer: No Typology Code available for payment source | Admitting: Gastroenterology

## 2019-11-20 ENCOUNTER — Other Ambulatory Visit: Payer: Self-pay

## 2019-11-20 VITALS — BP 98/58 | HR 77 | Temp 97.5°F | Resp 15 | Ht 74.0 in | Wt 218.0 lb

## 2019-11-20 DIAGNOSIS — K635 Polyp of colon: Secondary | ICD-10-CM

## 2019-11-20 DIAGNOSIS — Z860100 Personal history of colon polyps, unspecified: Secondary | ICD-10-CM

## 2019-11-20 DIAGNOSIS — K621 Rectal polyp: Secondary | ICD-10-CM

## 2019-11-20 DIAGNOSIS — D127 Benign neoplasm of rectosigmoid junction: Secondary | ICD-10-CM

## 2019-11-20 MED ORDER — SODIUM CHLORIDE 0.9 % IV SOLN: 500.0000 mL | Freq: Once | INTRAVENOUS | Status: DC

## 2019-11-20 NOTE — Progress Notes (Signed)
Pt's states no medical or surgical changes since previsit or office visit. 

## 2019-11-20 NOTE — Progress Notes (Signed)
Called to room to assist during endoscopic procedure.  Patient ID and intended procedure confirmed with present staff. Received instructions for my participation in the procedure from the performing physician.  

## 2019-11-20 NOTE — Patient Instructions (Signed)
YOU HAD AN ENDOSCOPIC PROCEDURE TODAY AT THE [facility]:   Refer to the procedure report that was given to you for any specific questions about what was found during the examination.  If the procedure report does not answer your questions, please call your gastroenterologist to clarify.  If you requested that your care partner not be given the details of your procedure findings, then the procedure report has been included in a sealed envelope for you to review at your convenience later.  YOU SHOULD EXPECT: Some feelings of bloating in the abdomen. Passage of more gas than usual.  Walking can help get rid of the air that was put into your GI tract during the procedure and reduce the bloating. If you had a lower endoscopy (such as a colonoscopy or flexible sigmoidoscopy) you may notice spotting of blood in your stool or on the toilet paper. If you underwent a bowel prep for your procedure, you may not have a normal bowel movement for a few days.  Please Note:  You might notice some irritation and congestion in your nose or some drainage.  This is from the oxygen used during your procedure.  There is no need for concern and it should clear up in a day or so.  SYMPTOMS TO REPORT IMMEDIATELY:   Following lower endoscopy (colonoscopy or flexible sigmoidoscopy):  Excessive amounts of blood in the stool  Significant tenderness or worsening of abdominal pains  Swelling of the abdomen that is new, acute  Fever of 100F or higher  For urgent or emergent issues, a gastroenterologist can be reached at any hour by calling [phone]. Do not use MyChart messaging for urgent concerns.    DIET:  We do recommend a small meal at first, but then you may proceed to your regular diet.  Drink plenty of fluids but you should avoid alcoholic beverages for 24 hours.  ACTIVITY:  You should plan to take it easy for the rest of today and you should NOT DRIVE or use heavy machinery until tomorrow (because  of the sedation medicines used during the test).    FOLLOW UP: Our staff will call the number listed on your records 48-72 hours following your procedure to check on you and address any questions or concerns that you may have regarding the information given to you following your procedure. If we do not reach you, we will leave a message.  We will attempt to reach you two times.  During this call, we will ask if you have developed any symptoms of COVID 19. If you develop any symptoms (ie: fever, flu-like symptoms, shortness of breath, cough etc.) before then, please call [phone].  If you test positive for Covid 19 in the 2 weeks post procedure, please call and report this information to us.    If any biopsies were taken you will be contacted by phone or by letter within the next 1-3 weeks.  Please call us at [phone] if you have not heard about the biopsies in 3 weeks.    SIGNATURES/CONFIDENTIALITY: You and/or your care partner have signed paperwork which will be entered into your electronic medical record.  These signatures attest to the fact that that the information above on your After Visit Summary has been reviewed and is understood.  Full responsibility of the confidentiality of this discharge information lies with you and/or your care-partner. 

## 2019-11-20 NOTE — Progress Notes (Signed)
pt tolerated well. VSS. awake and to recovery. Report given to RN.  

## 2019-11-20 NOTE — Op Note (Addendum)
[place] Patient Name: Dylan Stewart Procedure Date: 11/20/2019 8:29 AM MRN: [id number] Endoscopist: Remo Lipps P. Havery Moros , MD Age: 54 Referring MD:  Date of Birth: March 05, 1966 Gender: Male Account #: [id number] Procedure:                Colonoscopy Indications:              High risk colon cancer surveillance: Personal                            history of colonic polyps (9 adenomas / SSPs) in                            04/2016 Medicines:                Monitored Anesthesia Care Procedure:                Pre-Anesthesia Assessment:                           - Prior to the procedure, a History and Physical                            was performed, and patient medications and                            allergies were reviewed. The patient's tolerance of                            previous anesthesia was also reviewed. The risks                            and benefits of the procedure and the sedation                            options and risks were discussed with the patient.                            All questions were answered, and informed consent                            was obtained. Prior Anticoagulants: The patient has                            taken no previous anticoagulant or antiplatelet                            agents. ASA Grade Assessment: II - A patient with                            mild systemic disease. After reviewing the risks                            and benefits, the patient was deemed in  satisfactory condition to undergo the procedure.                           After obtaining informed consent, the colonoscope                            was passed under direct vision. Throughout the                            procedure, the patient's blood pressure, pulse, and                            oxygen saturations were monitored continuously. The                            Colonoscope was introduced through the anus and                             advanced to the the cecum, identified by                            appendiceal orifice and ileocecal valve. The                            colonoscopy was performed without difficulty. The                            patient tolerated the procedure well. The quality                            of the bowel preparation was good. The ileocecal                            valve, appendiceal orifice, and rectum were                            photographed. Scope In: 8:38:50 AM Scope Out: 9:02:25 AM Scope Withdrawal Time: 0 hours 18 minutes 39 seconds  Total Procedure Duration: 0 hours 23 minutes 35 seconds  Findings:                 The perianal and digital rectal examinations were                            normal.                           Three sessile polyps were found in the                            recto-sigmoid colon. The polyps were 2 to 4 mm in                            size. These polyps were removed with a cold biopsy  forceps. Resection and retrieval were complete.                           A 5 mm polyp was found in the proximal rectum. The                            polyp was sessile. The polyp was removed with a                            cold snare. Resection and retrieval were complete.                           Internal hemorrhoids were found during retroflexion.                           The exam was otherwise without abnormality. Complications:            No immediate complications. Estimated blood loss:                            Minimal. Estimated Blood Loss:     Estimated blood loss was minimal. Impression:               - Three 2 to 4 mm polyps at the recto-sigmoid                            colon, removed with a cold biopsy forceps. Resected                            and retrieved.                           - One 5 mm polyp in the proximal rectum, removed                            with a cold snare. Resected and  retrieved.                           - Internal hemorrhoids.                           - The examination was otherwise normal. Recommendation:           - Patient has a contact number available for                            emergencies. The signs and symptoms of potential                            delayed complications were discussed with the                            patient. Return to normal activities tomorrow.  Written discharge instructions were provided to the                            patient.                           - Resume previous diet.                           - Continue present medications.                           - Await pathology results. Remo Lipps P. Karissa Meenan, MD 11/20/2019 9:07:07 AM This report has been signed electronically.

## 2019-11-24 ENCOUNTER — Telehealth: Payer: Self-pay | Admitting: *Deleted

## 2019-11-24 NOTE — Telephone Encounter (Signed)
Attempted f/u phone call. No answer. Left message. °

## 2019-11-24 NOTE — Telephone Encounter (Signed)
°  Follow up Call-  Call back number 11/20/2019  Post procedure Call Back phone  # [phone]  Permission to leave phone message Yes  Some recent data might be hidden    No answer at 2nd attempt follow up phone call.  Left message on voicemail.

## 2020-09-16 DIAGNOSIS — F411 Generalized anxiety disorder: Secondary | ICD-10-CM | POA: Diagnosis not present

## 2020-10-14 DIAGNOSIS — F411 Generalized anxiety disorder: Secondary | ICD-10-CM | POA: Diagnosis not present

## 2020-11-07 DIAGNOSIS — F411 Generalized anxiety disorder: Secondary | ICD-10-CM | POA: Diagnosis not present

## 2020-11-08 DIAGNOSIS — M9901 Segmental and somatic dysfunction of cervical region: Secondary | ICD-10-CM | POA: Diagnosis not present

## 2020-11-08 DIAGNOSIS — M9903 Segmental and somatic dysfunction of lumbar region: Secondary | ICD-10-CM | POA: Diagnosis not present

## 2020-11-08 DIAGNOSIS — M50123 Cervical disc disorder at C6-C7 level with radiculopathy: Secondary | ICD-10-CM | POA: Diagnosis not present

## 2020-11-08 DIAGNOSIS — M5137 Other intervertebral disc degeneration, lumbosacral region: Secondary | ICD-10-CM | POA: Diagnosis not present

## 2020-11-15 DIAGNOSIS — M9901 Segmental and somatic dysfunction of cervical region: Secondary | ICD-10-CM | POA: Diagnosis not present

## 2020-11-15 DIAGNOSIS — M50123 Cervical disc disorder at C6-C7 level with radiculopathy: Secondary | ICD-10-CM | POA: Diagnosis not present

## 2020-11-15 DIAGNOSIS — M5137 Other intervertebral disc degeneration, lumbosacral region: Secondary | ICD-10-CM | POA: Diagnosis not present

## 2020-11-15 DIAGNOSIS — M9903 Segmental and somatic dysfunction of lumbar region: Secondary | ICD-10-CM | POA: Diagnosis not present

## 2020-11-17 DIAGNOSIS — M50123 Cervical disc disorder at C6-C7 level with radiculopathy: Secondary | ICD-10-CM | POA: Diagnosis not present

## 2020-11-17 DIAGNOSIS — M9901 Segmental and somatic dysfunction of cervical region: Secondary | ICD-10-CM | POA: Diagnosis not present

## 2020-11-17 DIAGNOSIS — M9903 Segmental and somatic dysfunction of lumbar region: Secondary | ICD-10-CM | POA: Diagnosis not present

## 2020-11-17 DIAGNOSIS — M5137 Other intervertebral disc degeneration, lumbosacral region: Secondary | ICD-10-CM | POA: Diagnosis not present

## 2020-12-07 DIAGNOSIS — M50123 Cervical disc disorder at C6-C7 level with radiculopathy: Secondary | ICD-10-CM | POA: Diagnosis not present

## 2020-12-07 DIAGNOSIS — M9903 Segmental and somatic dysfunction of lumbar region: Secondary | ICD-10-CM | POA: Diagnosis not present

## 2020-12-07 DIAGNOSIS — M9901 Segmental and somatic dysfunction of cervical region: Secondary | ICD-10-CM | POA: Diagnosis not present

## 2020-12-07 DIAGNOSIS — M5137 Other intervertebral disc degeneration, lumbosacral region: Secondary | ICD-10-CM | POA: Diagnosis not present

## 2020-12-09 DIAGNOSIS — F411 Generalized anxiety disorder: Secondary | ICD-10-CM | POA: Diagnosis not present

## 2020-12-19 DIAGNOSIS — M5137 Other intervertebral disc degeneration, lumbosacral region: Secondary | ICD-10-CM | POA: Diagnosis not present

## 2020-12-19 DIAGNOSIS — M9903 Segmental and somatic dysfunction of lumbar region: Secondary | ICD-10-CM | POA: Diagnosis not present

## 2020-12-19 DIAGNOSIS — M50123 Cervical disc disorder at C6-C7 level with radiculopathy: Secondary | ICD-10-CM | POA: Diagnosis not present

## 2020-12-19 DIAGNOSIS — M9901 Segmental and somatic dysfunction of cervical region: Secondary | ICD-10-CM | POA: Diagnosis not present

## 2021-01-11 DIAGNOSIS — F411 Generalized anxiety disorder: Secondary | ICD-10-CM | POA: Diagnosis not present

## 2021-01-16 DIAGNOSIS — M5137 Other intervertebral disc degeneration, lumbosacral region: Secondary | ICD-10-CM | POA: Diagnosis not present

## 2021-01-16 DIAGNOSIS — M50123 Cervical disc disorder at C6-C7 level with radiculopathy: Secondary | ICD-10-CM | POA: Diagnosis not present

## 2021-01-16 DIAGNOSIS — M9903 Segmental and somatic dysfunction of lumbar region: Secondary | ICD-10-CM | POA: Diagnosis not present

## 2021-01-16 DIAGNOSIS — M9901 Segmental and somatic dysfunction of cervical region: Secondary | ICD-10-CM | POA: Diagnosis not present

## 2021-02-06 DIAGNOSIS — F411 Generalized anxiety disorder: Secondary | ICD-10-CM | POA: Diagnosis not present

## 2021-02-22 IMAGING — CT CT HEART SCORING
3 series · 14 of 20 positions shown, 16 images · non-contrast
Comparison: Chest radiograph of 11/16/2015. abdominopelvic CT of
11/25/2017.

CLINICAL DATA: Hyperlipidemia.

EXAM:
CT HEART FOR CALCIUM SCORING
TECHNIQUE: CT heart was performed using prospective ECG gating.
A non-contrast exam for calcium scoring was performed.
Note that this exam targets the heart and the chest was not imaged
in its entirety.

[Series 2: calcium scoring 2.00 qr36 bestdiast 70% · axial · 0.43mm/px · z∈[+1557,+1653]mm · 4 of 80 slices shown]
[im 16/80  vessel]
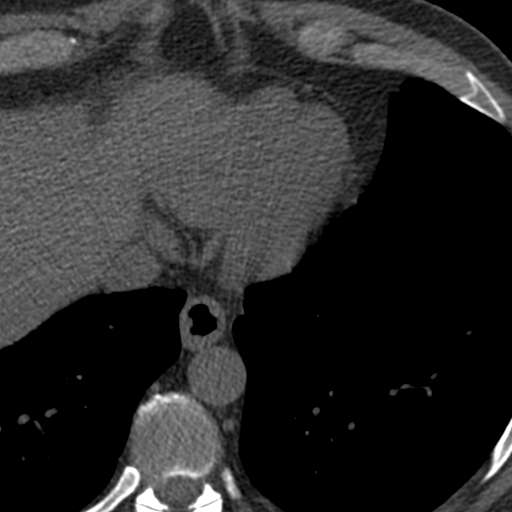
[im 32/80  vessel]
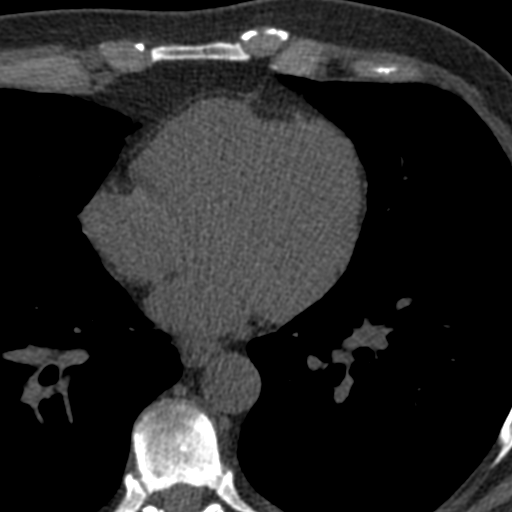
[im 48/80  vessel]
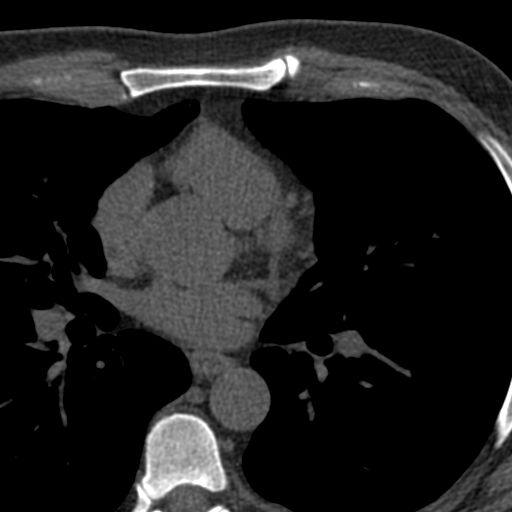
[im 64/80  vessel]
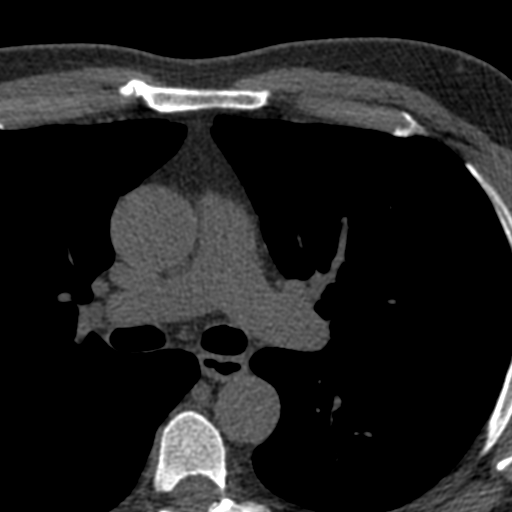

[Series 3: calcium scoring 2.00 br40 bestdiast 70% ax fov · axial · 0.65mm/px · z∈[+1553,+1657]mm · 5 of 80 slices shown, 7 images]
[im 14/80  vessel]
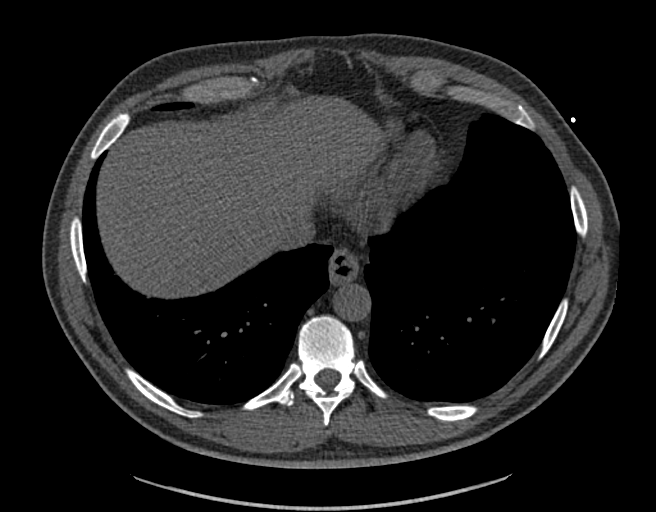
[im 14/80  lung]
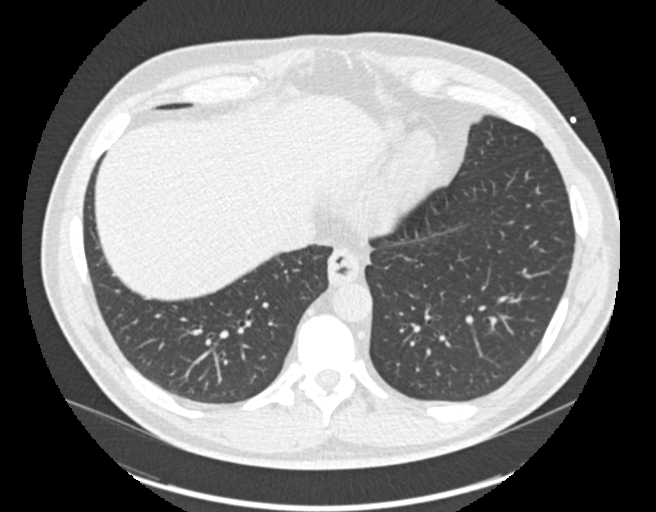
[im 27/80  vessel]
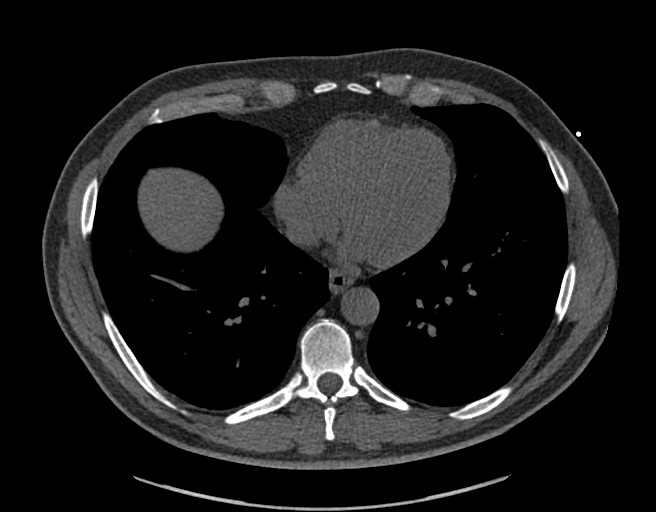
[im 40/80  vessel]
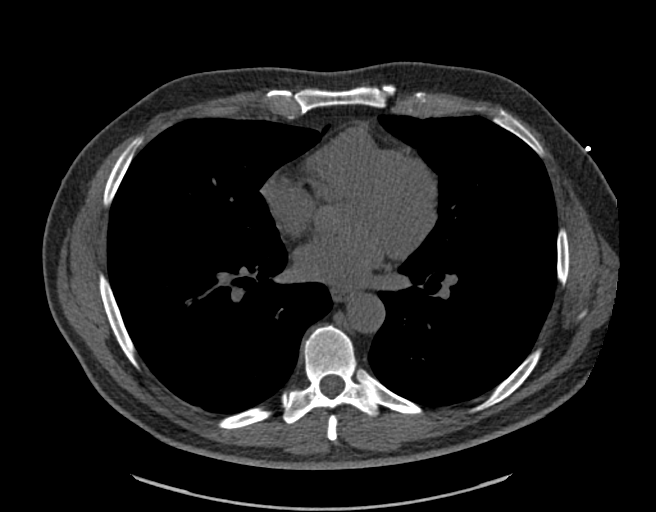
[im 53/80  vessel]
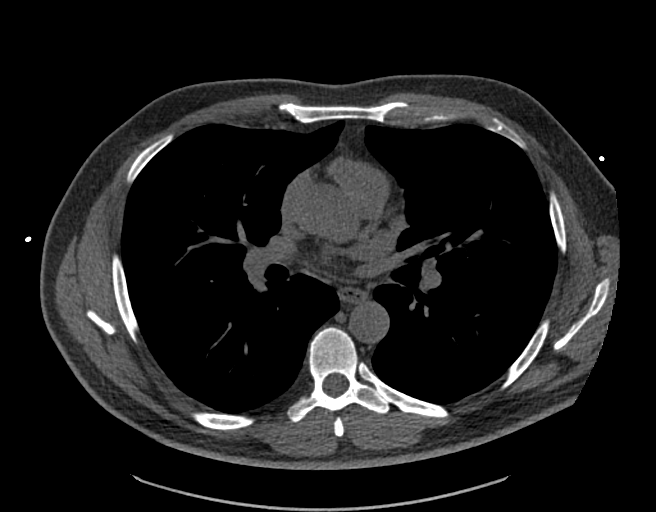
[im 66/80  vessel]
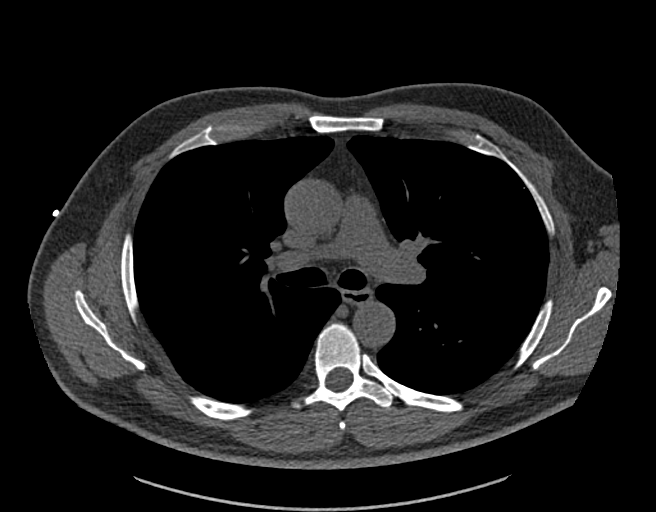
[im 66/80  lung]
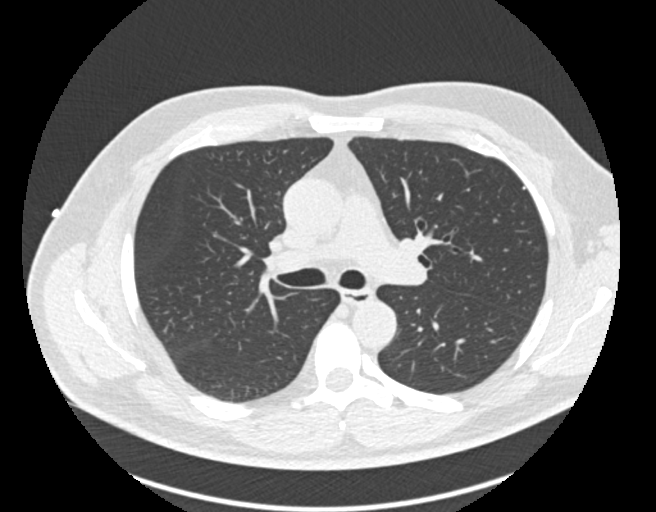

[Series 9: calcium scoring 2.00 br60 bestdiast 70% ax fov · axial · 0.64mm/px · z∈[+1553,+1657]mm · 5 of 80 slices shown]
[im 14/80  vessel]
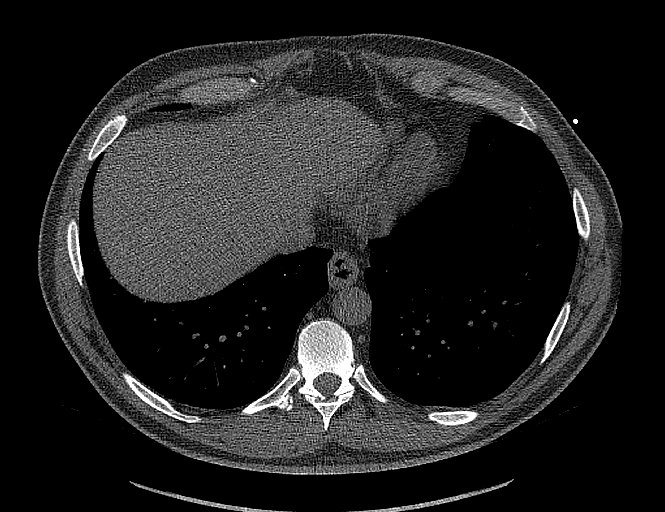
[im 27/80  vessel]
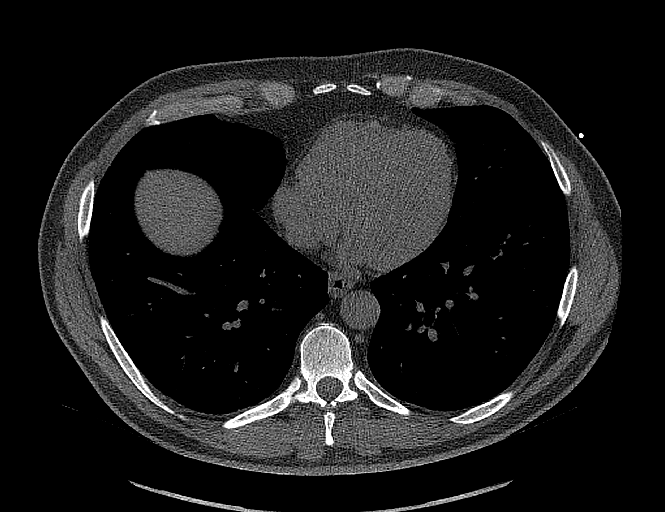
[im 40/80  vessel]
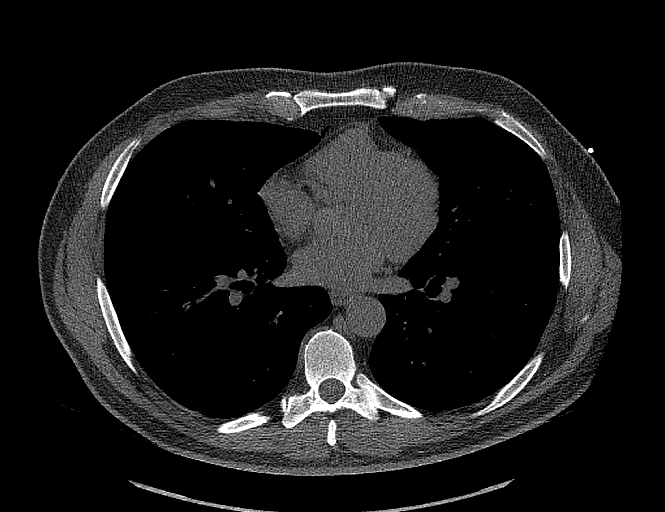
[im 53/80  vessel]
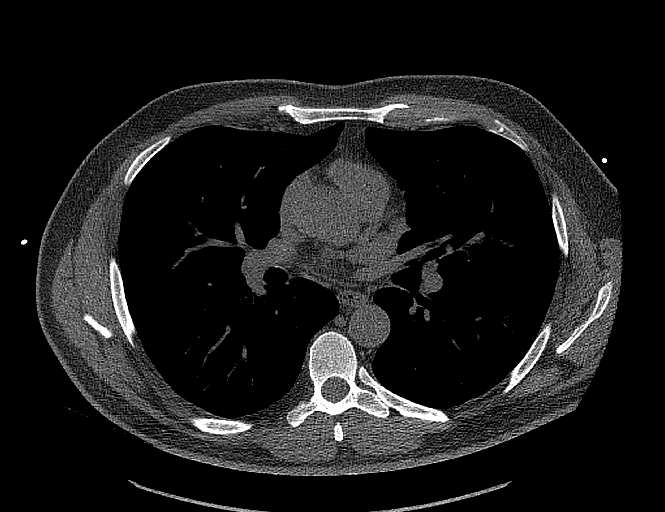
[im 66/80  vessel]
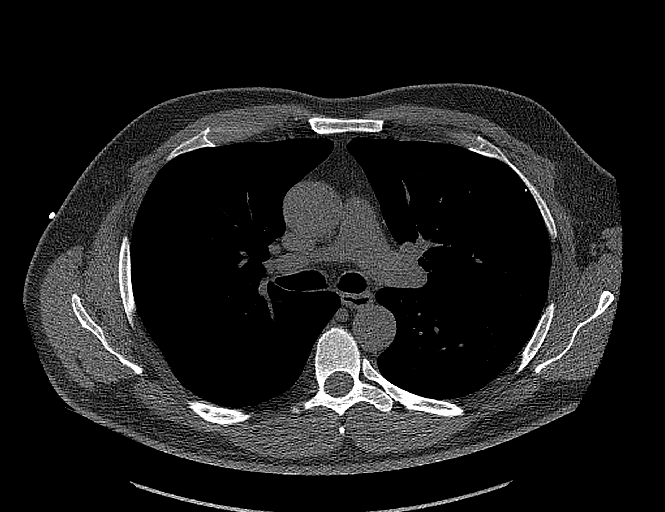

[14 of 20 positions shown; findings below may reference images not displayed]

FINDINGS: Technical quality: Good.

CORONARY CALCIUM

Total Agatston Score: 0-no coronary calcium identified.

OTHER FINDINGS:

Cardiovascular: Aortic atherosclerosis. Normal heart size, without
pericardial effusion.

Mediastinum/Nodes: No imaged thoracic adenopathy. A periesophageal
2.2 cm low-density structure anteriorly on image 73/3.

Lungs/Pleura: No imaged pleural fluid. Left lower lobe pulmonary
nodule measures 5 mm on image 70/9. An adjacent area of pleural
irregularity versus a pleural-based nodule of 4 mm on coronal image
84.

Upper Abdomen: Normal imaged portions of the liver, spleen.

Musculoskeletal: No acute osseous abnormality.
IMPRESSION: 1. No coronary calcium identified.
2. No acute process in the imaged chest.
3.  Aortic Atherosclerosis ([id number]).
4. Low-density structure anterior the esophagus may represent a GI
duplication cyst or an epiphrenic diverticulum. This is similar on
11/25/2017.
5. Left lower lobe pulmonary nodule or nodules are also similar to
11/25/2017 and can be presumed benign.

## 2021-02-23 DIAGNOSIS — E785 Hyperlipidemia, unspecified: Secondary | ICD-10-CM | POA: Diagnosis not present

## 2021-02-23 DIAGNOSIS — Z125 Encounter for screening for malignant neoplasm of prostate: Secondary | ICD-10-CM | POA: Diagnosis not present

## 2021-02-28 DIAGNOSIS — Z0000 Encounter for general adult medical examination without abnormal findings: Secondary | ICD-10-CM | POA: Diagnosis not present

## 2021-02-28 DIAGNOSIS — Z1339 Encounter for screening examination for other mental health and behavioral disorders: Secondary | ICD-10-CM | POA: Diagnosis not present

## 2021-02-28 DIAGNOSIS — E785 Hyperlipidemia, unspecified: Secondary | ICD-10-CM | POA: Diagnosis not present

## 2021-02-28 DIAGNOSIS — Z1331 Encounter for screening for depression: Secondary | ICD-10-CM | POA: Diagnosis not present

## 2021-02-28 DIAGNOSIS — Z23 Encounter for immunization: Secondary | ICD-10-CM | POA: Diagnosis not present

## 2021-03-01 DIAGNOSIS — F411 Generalized anxiety disorder: Secondary | ICD-10-CM | POA: Diagnosis not present

## 2021-03-17 ENCOUNTER — Telehealth: Payer: Self-pay

## 2021-03-17 NOTE — Telephone Encounter (Signed)
Referral from Dr. Joylene Draft for [facility].  Called and requested last OV and labs

## 2021-03-21 DIAGNOSIS — F411 Generalized anxiety disorder: Secondary | ICD-10-CM | POA: Diagnosis not present

## 2021-04-17 DIAGNOSIS — H16223 Keratoconjunctivitis sicca, not specified as Sjogren's, bilateral: Secondary | ICD-10-CM | POA: Diagnosis not present

## 2021-04-19 DIAGNOSIS — F411 Generalized anxiety disorder: Secondary | ICD-10-CM | POA: Diagnosis not present

## 2021-04-21 ENCOUNTER — Encounter: Payer: Self-pay | Admitting: Gastroenterology

## 2021-04-21 ENCOUNTER — Ambulatory Visit (INDEPENDENT_AMBULATORY_CARE_PROVIDER_SITE_OTHER): Payer: BC Managed Care – PPO | Admitting: Gastroenterology

## 2021-04-21 VITALS — BP 118/70 | HR 79 | Ht 73.0 in | Wt 210.0 lb

## 2021-04-21 DIAGNOSIS — K219 Gastro-esophageal reflux disease without esophagitis: Secondary | ICD-10-CM

## 2021-04-21 DIAGNOSIS — D509 Iron deficiency anemia, unspecified: Secondary | ICD-10-CM

## 2021-04-21 DIAGNOSIS — Z86010 Personal history of colonic polyps: Secondary | ICD-10-CM | POA: Diagnosis not present

## 2021-04-21 DIAGNOSIS — Z79899 Other long term (current) drug therapy: Secondary | ICD-10-CM | POA: Diagnosis not present

## 2021-04-21 MED ORDER — SUTAB 1479-225-188 MG PO TABS: 1.0000 | ORAL_TABLET | Freq: Once | ORAL | 0 refills | Status: AC

## 2021-04-21 NOTE — Progress Notes (Signed)
HPI :  56 year old male here for a follow-up visit for newly diagnosed iron deficiency anemia.  I last saw him in September 2021 for colonoscopy, results as outlined below.  He was referred back by Dr. Joylene Draft for iron deficiency.  He had labs done in December as outlined as part of routine physical:  Labs 02/23/21: Hgb 13.0, MCV 76.7, plt 386, WBC 8.24  AST 13, ALT 15, AP 70, T bil 0.3 BUN 13, Cr 1.2  02/28/2021: Iron 28, iron sat 7%, ferritin 7   He denies any history of iron deficiency in the past.  He does have some low energy, but states taking iron supplementation since he was found to have low iron levels has not really made much of a difference yet.  He is not sure if stress or lack of exercising could be related to his energy levels.  He denies any blood in his stools.  He has very rare scant blood in the toilet paper when he wipes himself after bowel movement.  He is moving his bowel movements regularly usually a few times per day.  No diarrhea.  He has an occasional pain in his mid abdomen him whenever he sneezes but outside of sneezing he does not really experience that at all.  No prandial relationship with that symptom.  He has been taking omeprazole 40 mg daily for more than 5 years.  States it works pretty well to control his symptoms but he does have rare breakthrough of pyrosis if he ingests known food triggers.  He states this is perhaps once per month.  He endorses having dysphagia in the past and being "dilated" in the past but is not sure if that really helped him.  On review of procedures he had an EGD and EUS with Dr. 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  He states being on PPI resolved his dysphagia and he does not have that anymore.  He did have an EGD earlier in 2009 as well with a savory dilation which she  states did not provide any significant lasting benefit.  Overall, no dysphagia at this point time.  No nausea or vomiting.  No postprandial pains.  He denies any NSAID use.  Prior workup: Colonoscopy 11/20/2019:The perianal and digital rectal examinations were normal. - Three sessile polyps were found in the recto-sigmoid colon. The polyps were 2 to 4 mm in size. These polyps were removed with a cold biopsy forceps. Resection and retrieval were complete. - A 5 mm polyp was found in the proximal rectum. The polyp was sessile. The polyp was removed with a cold snare. Resection and retrieval were complete. - Internal hemorrhoids were found during retroflexion. - The exam was otherwise without abnormality.  1. Surgical [P], colon, rectosigmoid, polyp - HYPERPLASTIC POLYP(S). - NO ADENOMATOUS CHANGE OR CARCINOMA. 2. Surgical [P], colon, rectosigmoid, polyp (2) - HYPERPLASTIC POLYP (2). - NO ADENOMATOUS CHANGE OR CARCINOMA.  Told to repeat in 5 years   Colonoscopy 04/27/2016:The perianal and digital rectal examinations were normal. - A 4 mm polyp was found in the cecum. The polyp was sessile. The polyp was removed with a cold snare. Resection and retrieval were complete. - Six sessile polyps were found in the ascending colon. The polyps were 3 to 8 mm in size. These polyps were removed with a cold snare.  Resection and retrieval were complete. - A 5 mm polyp was found in the hepatic flexure. The polyp was sessile. The polyp was removed with a cold snare. Resection and retrieval were complete. - A 4 mm polyp was found in the transverse colon. The polyp was sessile. The polyp was removed with a cold snare. Resection and retrieval were complete. - Internal hemorrhoids were found. The hemorrhoids were moderate. - The bowel preparation was only fair during insertion and several minutes were spent lavaging the colon to achieve adequate views. The exam was otherwise without abnormality.  Surgical  [P], ascending-6 and cecum and hepatic flexure and transverse, polyp (9) - TUBULAR ADENOMAS, MULTIPLE. - NO HIGH GRADE DYSPLASIA OR MALIGNANCY IDENTIFIED. - SESSILE SERRATED ADENOMAS, MULTIPLE.   CT abdomen / pelvis 11/25/2017 -  IMPRESSION: 1. No evidence of bowel obstruction or inflammation. 2. 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 No change since prior study. 3. Small bilateral inguinal hernias containing fat.    Past Medical History:  Diagnosis Date   Adenomatous polyps    Allergy    Anxiety    PAST HX- ON MEDICATION CURRENTLY    Asthma    GERD (gastroesophageal reflux disease)    Hemorrhoids    Hyperlipidemia      Past Surgical History:  Procedure Laterality Date   APPENDECTOMY     COLONOSCOPY     NASAL SEPTUM SURGERY  2000   POLYPECTOMY     UPPER GASTROINTESTINAL ENDOSCOPY     WISDOM TOOTH EXTRACTION     Family History  Problem Relation Age of Onset   Pancreatic cancer Mother    Diabetes Paternal Grandfather    Diabetes Father    Colon cancer Neg Hx    Colon polyps Neg Hx    Esophageal cancer Neg Hx    Rectal cancer Neg Hx    Stomach cancer Neg Hx    Social History   Tobacco Use   Smoking status: Never   Smokeless tobacco: Never  Vaping Use   Vaping Use: Never used  Substance Use Topics   Alcohol use: Yes    Comment: socially   Drug use: No   Current Outpatient Medications  Medication Sig Dispense Refill   acetaminophen (TYLENOL) 500 MG tablet Take 1,000 mg by mouth every 6 (six) hours as needed for pain.      beclomethasone (QVAR REDIHALER) 80 MCG/ACT inhaler 1 puff as needed.     cetirizine (ZYRTEC) 10 MG tablet Take 10 mg by mouth as needed for allergies.     DULoxetine (CYMBALTA) 30 MG capsule Take 30 mg by mouth daily.     ezetimibe (ZETIA) 10 MG tablet ezetimibe 10 mg tablet     Multiple Vitamin (MULTI-VITAMIN DAILY PO)      omeprazole (PRILOSEC) 40 MG capsule Take 40 mg by mouth daily.     Triamcinolone  Acetonide (NASACORT ALLERGY 24HR NA) as needed.     No current facility-administered medications for this visit.   Allergies  Allergen Reactions   Morphine And Related Nausea Only   Statins      Review of Systems: All systems reviewed and negative except where noted in HPI.    Labs per HPI as above  Physical Exam: BP 118/70    Pulse 79    Ht 6\' 1"  (1.854 m)    Wt 210 lb (95.3 kg)    BMI 27.71 kg/m  Constitutional: Pleasant,well-developed, male in no acute distress. Abdominal: Soft, nondistended, nontender.  There  are no masses palpable.  Extremities: no edema Lymphadenopathy: No cervical adenopathy noted. Neurological: Alert and oriented to person place and time. Skin: Skin is warm and dry. No rashes noted. Psychiatric: Normal mood and affect. Behavior is normal.   ASSESSMENT AND PLAN: 56 year old male here for reassessment of the following issues:  Iron deficiency GERD Long-term use of PPI History of colon polyps  The patient has developed an iron deficiency without anemia this past December.  We discussed differential diagnosis of this, unclear etiology at this point time.  His colonoscopy was not too long ago without any high risk lesions.  Patient is rather anxious about what could be causing this, I reassured him that it is very unlikely to be colon cancer or high risk polyp.  He does have chronic reflux symptoms that is well controlled with PPI.  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  We discussed long-term use of PPI and potential associated risks.  Sounds like benefits outweigh risks for him at this point time, although long-term want to use the lowest dose of PPI needed to control symptoms, he has potential for dose lowering.  He has not had a recent EGD to screen for Barrett's esophagus.  In light of his IDA and history of GERD longstanding I think an EGD is reasonable to further evaluate.   We discussed what this exam entails.  While I think it overall unlikely that he has any significant colonic lesion causing iron deficiency, for peace of mind I offered him a colonoscopy to be done at the same time given the IDA is new since his last exam.  He wanted to proceed with both EGD and colonoscopy after discussion of risks and benefits, as well as that of anesthesia.  He will continue iron supplementation in the interim and repeat his blood work with Dr. Joylene Draft later this month to assess response to oral iron.  If EGD and colonoscopy are negative we will discuss if we need to pursue a capsule endoscopy or cross-sectional imaging.  Of note, abdominal pain I suspect is more than likely musculoskeletal given it only occurs with sneezing, he will keep an eye on this.   Plan: - schedule EGD and colonoscopy - continue PPI for now, await EGD, discussed long term risks - continue iron, repeat labs in upcoming weeks with Dr. Jacquelin Hawking, MD [facility]

## 2021-04-21 NOTE — Patient Instructions (Addendum)
If you are age 57 or older, your body mass index should be between 23-30. Your Body mass index is 27.71 kg/m. If this is out of the aforementioned range listed, please consider follow up with your Primary Care Provider.  If you are age 2 or younger, your body mass index should be between 19-25. Your Body mass index is 27.71 kg/m. If this is out of the aformentioned range listed, please consider follow up with your Primary Care Provider.   ________________________________________________________  The [facility] providers would like to encourage you to use [facility] to communicate with providers for non-urgent requests or questions.  Due to long hold times on the telephone, sending your provider a message by [facility] may be a faster and more efficient way to get a response.  Please allow 48 business hours for a response.  Please remember that this is for non-urgent requests.  _______________________________________________________  Dylan Stewart have been scheduled for an endoscopy and colonoscopy. Please follow the written instructions given to you at your visit today. Please pick up your prep supplies at the pharmacy within the next 1-3 days. If you use inhalers (even only as needed), please bring them with you on the day of your procedure.   You will be placed on a wait list for a sooner procedure.  Thank you for entrusting me with your care and for choosing [facility], Dr. Oran Cellar

## 2021-04-27 ENCOUNTER — Telehealth: Payer: Self-pay

## 2021-04-27 DIAGNOSIS — Z860100 Personal history of colon polyps, unspecified: Secondary | ICD-10-CM

## 2021-04-27 DIAGNOSIS — K219 Gastro-esophageal reflux disease without esophagitis: Secondary | ICD-10-CM

## 2021-04-27 DIAGNOSIS — D509 Iron deficiency anemia, unspecified: Secondary | ICD-10-CM

## 2021-04-27 DIAGNOSIS — Z79899 Other long term (current) drug therapy: Secondary | ICD-10-CM

## 2021-04-27 NOTE — Telephone Encounter (Signed)
Wonderful. Thanks Jan

## 2021-04-27 NOTE — Telephone Encounter (Signed)
Patient on wait list for ECL ([facility]) currently scheduled for 4-17.  Called and LM : offered patient opening on next Wed, 2-22 at 11:00am  to arrive at 10:00am. Asked for a call back as soon as possible.

## 2021-04-27 NOTE — Telephone Encounter (Signed)
Patient called back and CAN do his ECL next Wed, 2-22 at 11:00. Sending new instructions to patient's MyChart. He understands to call if he has any questions. Procedure on 4-17 cancelled (rescheduled to 2-22). Patient is not on BT and not DM, he will confirm he has [employer] on hand to start prepping on Tuesday. Understands he must have driver who can stay for Wednesday's procedure.

## 2021-05-03 ENCOUNTER — Other Ambulatory Visit: Payer: Self-pay

## 2021-05-03 ENCOUNTER — Encounter: Payer: Self-pay | Admitting: Gastroenterology

## 2021-05-03 ENCOUNTER — Ambulatory Visit (AMBULATORY_SURGERY_CENTER): Payer: BC Managed Care – PPO | Admitting: Gastroenterology

## 2021-05-03 VITALS — BP 90/52 | HR 79 | Temp 98.6°F | Resp 17 | Ht 73.0 in | Wt 210.0 lb

## 2021-05-03 DIAGNOSIS — K5730 Diverticulosis of large intestine without perforation or abscess without bleeding: Secondary | ICD-10-CM | POA: Diagnosis not present

## 2021-05-03 DIAGNOSIS — K219 Gastro-esophageal reflux disease without esophagitis: Secondary | ICD-10-CM | POA: Diagnosis not present

## 2021-05-03 DIAGNOSIS — K648 Other hemorrhoids: Secondary | ICD-10-CM | POA: Diagnosis not present

## 2021-05-03 DIAGNOSIS — D123 Benign neoplasm of transverse colon: Secondary | ICD-10-CM

## 2021-05-03 DIAGNOSIS — K2289 Other specified disease of esophagus: Secondary | ICD-10-CM | POA: Diagnosis not present

## 2021-05-03 DIAGNOSIS — K2970 Gastritis, unspecified, without bleeding: Secondary | ICD-10-CM | POA: Diagnosis not present

## 2021-05-03 DIAGNOSIS — K3189 Other diseases of stomach and duodenum: Secondary | ICD-10-CM | POA: Diagnosis not present

## 2021-05-03 DIAGNOSIS — K317 Polyp of stomach and duodenum: Secondary | ICD-10-CM

## 2021-05-03 DIAGNOSIS — K635 Polyp of colon: Secondary | ICD-10-CM | POA: Diagnosis not present

## 2021-05-03 DIAGNOSIS — D509 Iron deficiency anemia, unspecified: Secondary | ICD-10-CM

## 2021-05-03 MED ORDER — SODIUM CHLORIDE 0.9 % IV SOLN: 500.0000 mL | Freq: Once | INTRAVENOUS | Status: DC

## 2021-05-03 NOTE — Progress Notes (Signed)
VS by CW. ?

## 2021-05-03 NOTE — Progress Notes (Signed)
Pt non-responsive, VVS, Report to RN  °

## 2021-05-03 NOTE — Op Note (Signed)
[place] Patient Name: Dylan Stewart Procedure Date: 05/03/2021 10:21 AM MRN: [id number] Endoscopist: Remo Lipps P. Havery Moros , MD Age: 56 Referring MD:  Date of Birth: 1966-01-27 Gender: Male Account #: [id number] Procedure:                Colonoscopy Indications:              Iron deficiency anemia Medicines:                Monitored Anesthesia Care Procedure:                Pre-Anesthesia Assessment:                           - Prior to the procedure, a History and Physical                            was performed, and patient medications and                            allergies were reviewed. The patient's tolerance of                            previous anesthesia was also reviewed. The risks                            and benefits of the procedure and the sedation                            options and risks were discussed with the patient.                            All questions were answered, and informed consent                            was obtained. Prior Anticoagulants: The patient has                            taken no previous anticoagulant or antiplatelet                            agents. ASA Grade Assessment: II - A patient with                            mild systemic disease. After reviewing the risks                            and benefits, the patient was deemed in                            satisfactory condition to undergo the procedure.                           After obtaining informed consent, the colonoscope  was passed under direct vision. Throughout the                            procedure, the patient's blood pressure, pulse, and                            oxygen saturations were monitored continuously. The                            CF [id number] #[id number] was introduced through the anus                            and advanced to the the terminal ileum, with                            identification of the  appendiceal orifice and IC                            valve. The colonoscopy was performed without                            difficulty. The patient tolerated the procedure                            well. The quality of the bowel preparation was                            good. The terminal ileum, ileocecal valve,                            appendiceal orifice, and rectum were photographed. Scope In: 10:38:59 AM Scope Out: 10:59:59 AM Scope Withdrawal Time: 0 hours 16 minutes 20 seconds  Total Procedure Duration: 0 hours 21 minutes 0 seconds  Findings:                 The perianal and digital rectal examinations were                            normal.                           The terminal ileum appeared normal.                           A few small-mouthed diverticula were found in the                            sigmoid colon.                           A diminutive polyp was found in the transverse                            colon. The polyp was flat. The polyp was removed  with a cold snare. Resection and retrieval were                            complete.                           Internal hemorrhoids were found during                            retroflexion. The hemorrhoids were small.                           The exam was otherwise without abnormality. Complications:            No immediate complications. Estimated blood loss:                            Minimal. Estimated Blood Loss:     Estimated blood loss was minimal. Impression:               - The examined portion of the ileum was normal.                           - Diverticulosis in the sigmoid colon.                           - One diminutive polyp in the transverse colon,                            removed with a cold snare. Resected and retrieved.                           - Internal hemorrhoids.                           - The examination was otherwise normal.                           No  cause for iron deficiency on colonoscopy. Recommendation:           - Patient has a contact number available for                            emergencies. The signs and symptoms of potential                            delayed complications were discussed with the                            patient. Return to normal activities tomorrow.                            Written discharge instructions were provided to the                            patient.                           -  Resume previous diet.                           - Continue present medications.                           - Await pathology results.                           - See EGD note for recommendations Remo Lipps P. Tavia Stave, MD 05/03/2021 11:04:40 AM This report has been signed electronically.

## 2021-05-03 NOTE — Progress Notes (Signed)
Called to room to assist during endoscopic procedure.  Patient ID and intended procedure confirmed with present staff. Received instructions for my participation in the procedure from the performing physician.  

## 2021-05-03 NOTE — Op Note (Signed)
[place] Patient Name: Marcelus Dubberly Procedure Date: 05/03/2021 10:21 AM MRN: [id number] Endoscopist: Remo Lipps P. Havery Moros , MD Age: 56 Referring MD:  Date of Birth: [birth year] Gender: Male Account #: [id number] Procedure:                Upper GI endoscopy Indications:              Iron deficiency anemia, history of                            gastro-esophageal reflux disease and remote                            diagnosis of EoE - well controlled on omeprazole                            40mg  / day Medicines:                Monitored Anesthesia Care Procedure:                Pre-Anesthesia Assessment:                           - Prior to the procedure, a History and Physical                            was performed, and patient medications and                            allergies were reviewed. The patient's tolerance of                            previous anesthesia was also reviewed. The risks                            and benefits of the procedure and the sedation                            options and risks were discussed with the patient.                            All questions were answered, and informed consent                            was obtained. Prior Anticoagulants: The patient has                            taken no previous anticoagulant or antiplatelet                            agents. ASA Grade Assessment: II - A patient with                            mild systemic disease. After reviewing the risks  and benefits, the patient was deemed in                            satisfactory condition to undergo the procedure.                           After obtaining informed consent, the endoscope was                            passed under direct vision. Throughout the                            procedure, the patient's blood pressure, pulse, and                            oxygen saturations were monitored continuously. The                             Endoscope was introduced through the mouth, and                            advanced to the second part of duodenum. The upper                            GI endoscopy was accomplished without difficulty.                            The patient tolerated the procedure well. Scope In: Scope Out: Findings:                 Esophagogastric landmarks were identified: the                            Z-line was found at 37 cm, the gastroesophageal                            junction was found at 37 cm and the upper extent of                            the gastric folds was found at 38 cm from the                            incisors.                           A 1 cm hiatal hernia was present.                           The exam of the esophagus was otherwise normal                            other than some very subtle trachealization.  Biopsies were taken with a cold forceps in the                            upper third of the esophagus, in the middle third                            of the esophagus and in the lower third of the                            esophagus for histology, assess for eosinophilic                            esophagitis.                           Diffuse mildly erythematous mucosa was found in the                            gastric fundus, in the gastric body and in the                            gastric antrum.                           Multiple small sessile polyps were found in the                            gastric fundus and in the gastric body, grossly                            consistent with benign fundic gland polyps in the                            setting of PPI use. Two representative polys were                            removed with a cold biopsy forceps. Resection and                            retrieval were complete.                           The exam of the stomach was otherwise normal.                            Biopsies were taken with a cold forceps in the                            gastric body, at the incisura and in the gastric                            antrum for Helicobacter pylori testing.  The duodenal bulb and second portion of the                            duodenum were normal. Biopsies for histology were                            taken with a cold forceps for evaluation of celiac                            disease. Complications:            No immediate complications. Estimated blood loss:                            Minimal. Estimated Blood Loss:     Estimated blood loss was minimal. Impression:               - Esophagogastric landmarks identified.                           - 1 cm hiatal hernia.                           - Normal esophagus with very faint trachealization.                            Biopsies taken to assess for EoE                           - Erythematous mucosa in the gastric fundus,                            gastric body and antrum.                           - Multiple gastric polyps. Suspect benign fundic                            gland polyps. Representative samples resected and                            retrieved.                           - Normal stomach otherwise - biopsies taken to rule                            out H pylori                           - Normal duodenal bulb and second portion of the                            duodenum. Biopsied. Recommendation:           - Patient has a contact number available for  emergencies. The signs and symptoms of potential                            delayed complications were discussed with the                            patient. Return to normal activities tomorrow.                            Written discharge instructions were provided to the                            patient.                           - Resume previous diet.                           -  Continue present medications.                           - Await pathology results to see if any cause for                            iron deficiency (H pylori, celiac, etc).                           - Trend blood counts on iron supplementation Remo Lipps P. Damaria Stofko, MD 05/03/2021 11:14:04 AM This report has been signed electronically.

## 2021-05-03 NOTE — Patient Instructions (Addendum)
Resume previous medications.  1 polyp removed and biopsies taken.  Await results for final recommendations.    Handouts on findings given to patient ( polyps, diverticulosis, hemorrhoids)   YOU HAD AN ENDOSCOPIC PROCEDURE TODAY AT [facility]:   Refer to the procedure report that was given to you for any specific questions about what was found during the examination.  If the procedure report does not answer your questions, please call your gastroenterologist to clarify.  If you requested that your care partner not be given the details of your procedure findings, then the procedure report has been included in a sealed envelope for you to review at your convenience later.  YOU SHOULD EXPECT: Some feelings of bloating in the abdomen. Passage of more gas than usual.  Walking can help get rid of the air that was put into your GI tract during the procedure and reduce the bloating. If you had a lower endoscopy (such as a colonoscopy or flexible sigmoidoscopy) you may notice spotting of blood in your stool or on the toilet paper. If you underwent a bowel prep for your procedure, you may not have a normal bowel movement for a few days.  Please Note:  You might notice some irritation and congestion in your nose or some drainage.  This is from the oxygen used during your procedure.  There is no need for concern and it should clear up in a day or so.  SYMPTOMS TO REPORT IMMEDIATELY:  Following lower endoscopy (colonoscopy or flexible sigmoidoscopy):  Excessive amounts of blood in the stool  Significant tenderness or worsening of abdominal pains  Swelling of the abdomen that is new, acute  Fever of 100F or higher  Following upper endoscopy (EGD)  Vomiting of blood or coffee ground material  New chest pain or pain under the shoulder blades  Painful or persistently difficult swallowing  New shortness of breath  Fever of 100F or higher  Black, tarry-looking stools  For urgent or emergent  issues, a gastroenterologist can be reached at any hour by calling [phone]. Do not use MyChart messaging for urgent concerns.    DIET:  We do recommend a small meal at first, but then you may proceed to your regular diet.  Drink plenty of fluids but you should avoid alcoholic beverages for 24 hours.  ACTIVITY:  You should plan to take it easy for the rest of today and you should NOT DRIVE or use heavy machinery until tomorrow (because of the sedation medicines used during the test).    FOLLOW UP: Our staff will call the number listed on your records 48-72 hours following your procedure to check on you and address any questions or concerns that you may have regarding the information given to you following your procedure. If we do not reach you, we will leave a message.  We will attempt to reach you two times.  During this call, we will ask if you have developed any symptoms of COVID 19. If you develop any symptoms (ie: fever, flu-like symptoms, shortness of breath, cough etc.) before then, please call [phone].  If you test positive for Covid 19 in the 2 weeks post procedure, please call and report this information to [place].    If any biopsies were taken you will be contacted by phone or by letter within the next 1-3 weeks.  Please call us at [phone] if you have not heard about the biopsies in 3 weeks.    SIGNATURES/CONFIDENTIALITY: You  and/or your care partner have signed paperwork which will be entered into your electronic medical record.  These signatures attest to the fact that that the information above on your After Visit Summary has been reviewed and is understood.  Full responsibility of the confidentiality of this discharge information lies with you and/or your care-partner.

## 2021-05-03 NOTE — Progress Notes (Signed)
History and Physical Interval Note: Patient seen on 04/21/21 - no interval changes. Here for EGD and colonoscopy to evaluate iron deficiency anemia, also with history of GERD and reported EOE remotely, on chronic PPI. He feels well otherwise without complaints. Discussed risks / benefits of these procedures and he wishes to proceed.  05/03/2021 10:25 AM  Dylan Stewart  has presented today for endoscopic procedure(s), with the diagnosis of  Encounter Diagnoses  Name Primary?   Iron deficiency anemia, unspecified iron deficiency anemia type Yes   Gastroesophageal reflux disease, unspecified whether esophagitis present   .  The various methods of evaluation and treatment have been discussed with the patient and/or family. After consideration of risks, benefits and other options for treatment, the patient has consented to  the endoscopic procedure(s).   The patient's history has been reviewed, patient examined, no change in status, stable for surgery.  I have reviewed the patient's chart and labs.  Questions were answered to the patient's satisfaction.    Jolly Mango, MD [facility]

## 2021-05-05 ENCOUNTER — Telehealth: Payer: Self-pay | Admitting: *Deleted

## 2021-05-05 NOTE — Telephone Encounter (Signed)
°  Follow up Call-  Call back number 05/03/2021 11/20/2019  Post procedure Call Back phone  # [phone]  Permission to leave phone message Yes Yes  Some recent data might be hidden    [facility]

## 2021-05-11 DIAGNOSIS — F411 Generalized anxiety disorder: Secondary | ICD-10-CM | POA: Diagnosis not present

## 2021-05-15 DIAGNOSIS — D509 Iron deficiency anemia, unspecified: Secondary | ICD-10-CM | POA: Diagnosis not present

## 2021-06-07 DIAGNOSIS — F411 Generalized anxiety disorder: Secondary | ICD-10-CM | POA: Diagnosis not present

## 2021-06-26 ENCOUNTER — Encounter: Payer: PRIVATE HEALTH INSURANCE | Admitting: Gastroenterology

## 2021-07-03 DIAGNOSIS — F411 Generalized anxiety disorder: Secondary | ICD-10-CM | POA: Diagnosis not present

## 2021-07-24 DIAGNOSIS — F411 Generalized anxiety disorder: Secondary | ICD-10-CM | POA: Diagnosis not present

## 2021-08-14 DIAGNOSIS — F411 Generalized anxiety disorder: Secondary | ICD-10-CM | POA: Diagnosis not present

## 2021-08-28 DIAGNOSIS — E785 Hyperlipidemia, unspecified: Secondary | ICD-10-CM | POA: Diagnosis not present

## 2021-08-28 DIAGNOSIS — L820 Inflamed seborrheic keratosis: Secondary | ICD-10-CM | POA: Diagnosis not present

## 2021-08-28 DIAGNOSIS — D225 Melanocytic nevi of trunk: Secondary | ICD-10-CM | POA: Diagnosis not present

## 2021-08-28 DIAGNOSIS — L821 Other seborrheic keratosis: Secondary | ICD-10-CM | POA: Diagnosis not present

## 2021-08-28 DIAGNOSIS — D1801 Hemangioma of skin and subcutaneous tissue: Secondary | ICD-10-CM | POA: Diagnosis not present

## 2021-08-28 DIAGNOSIS — D2272 Melanocytic nevi of left lower limb, including hip: Secondary | ICD-10-CM | POA: Diagnosis not present

## 2021-08-28 DIAGNOSIS — L918 Other hypertrophic disorders of the skin: Secondary | ICD-10-CM | POA: Diagnosis not present

## 2021-10-16 DIAGNOSIS — F411 Generalized anxiety disorder: Secondary | ICD-10-CM | POA: Diagnosis not present

## 2021-11-10 DIAGNOSIS — F411 Generalized anxiety disorder: Secondary | ICD-10-CM | POA: Diagnosis not present

## 2021-12-29 DIAGNOSIS — F411 Generalized anxiety disorder: Secondary | ICD-10-CM | POA: Diagnosis not present

## 2022-01-09 NOTE — ED Provider Notes (Signed)
 [This document is intended as a means of communication between members of your health care team. It is structured based on guidelines provided for the Centers for Medicare and Medicaid Services.   Abbreviations and acronyms may be used that are unfamiliar to non-health care affiliated individuals.  If you have questions about your medical records, please consult your health care team.]  Emergency Department Physician Note:  Interpreter: Not utilized as patient's preferred language is English  HPI:  Dylan Stewart is a 56 y.o. male presenting with concern for abdominal cramping and diarrhea.  Patient ate at a restaurant and had ceviche 2 days ago and has been ill since then.  Having multiple bouts of diarrhea and abdominal cramping.  Feeling some nausea but no vomiting.  Has a history of reflux but no other GI disorder.  No fevers or chills.  No history of inflammatory bowel disease.  Has had a previous appendectomy.  Old chart reviewed:  Summary of pertinent elements includes -seen previously at outside hospital for reflux, had upper endoscopy in February 2023 with polyps seen and small hiatal hernia  Past Medical History:  Past Medical History:  Diagnosis Date  . Hypercholesterolemia     Past Surgical History:  Past Surgical History:  Procedure Laterality Date  . APPENDECTOMY       Allergies:  Allergies  Allergen Reactions  . Morphine  And Related Nausea Only  . Statins Unknown    Social History:  Social History   Tobacco Use  . Smoking status: Never  . Smokeless tobacco: Not on file  Substance Use Topics  . Alcohol  use: Not on file    Family History:   History reviewed. No pertinent family history.  Review Of Systems:    Constitutional: no fever or chills   Skin: no rash or itching   Ears/Nose/Throat: no or sore throat   Respiratory: no cough or shortness of breath   Cardiovascular: no chest pain or palpitations   Gastrointestinal: +lower abdominal  cramping, nausea, diarrhea   Genitourinary: no dysuria or hematuria   Musculoskeletal: no leg swelling or joint pain   Neurologic: no headache or syncope, +lightheadedness   Hematologic/Lymphatic/Immunologic: no known history of immunocompromise, no lymphadenopathy or easy bruising/bleeding   Endocrine: no polyuria or polydipsia, no hx DM  A Full 10 point ROS is completed and remainder of ROS is negative except as above or per HPI   Physical Exam:  Patient Vitals for the past 24 hrs:  BP Temp Temp src Pulse Resp SpO2  01/09/22 1516 142/81 -- -- 100 20 98 %  01/09/22 1438 -- -- -- 102 20 99 %  01/09/22 1328 -- -- -- 104 18 98 %  01/09/22 1225 115/72 37.6 C (99.7 F) Oral 107 16 96 %    Constitutional:  alert, in no acute distress, oriented times 3 and non-toxic appearing, nursing triage notes reviewed and vital signs reviewed HENT:  Mucous membranes dry Eyes:  pupils equal round, no icterus Neck:  neck supple Pulmonary/lungs:  clear Cardiac/chest:  Regular, tachycardic Abdomen:  Soft, nondistended and mild lower abdominal tenderness without rebound or guarding, no palpable hernia organomegaly Extremities:  moves all extremities, Normal peripheral perfusion and pulses, symmetric pulses to extremities, no edema and No calf tenderness Neuro:  Alert normally oriented, Normal speech, Strength normal, Tone normal and Gait normal Skin:  no rash or lesion, warm and dry, no jaundice Psych:  normal affect   Additional Medical Decision Making: Patient with GI illness.  Given sick  contact with similar symptoms and diarrhea, suspect foodborne or infectious.  Dry on exam.  A differential diagnosis at this time includes but is not limited to: Dehydration, colitis, infectious diarrhea.  Pulse oximetry performed, and is 98 %, on RA which is normal.    An order was placed for cardiac telemetry and continuous tele monitoring and at 3:54 PM PDT  of rhythm strip shows: rhythm sinus , with a rate of  102, my interpretation  Labs show, my interpretation: CBC -White blood cell count 11.8, no anemia or thrombocytopenia BMP with creatinine 1.3, no recent prior to compare, glucose 111, sodium mildly low 133 likely from GI losses, normal chloride and potassium LFTs - no liver failure, no significant transaminitis Lipase - not elevated, not indicative of pancreatitis COVID-negative  Patient was hydrated with IV fluids.  Given Zofran  for nausea, Pepcid and ultimately Bentyl for GI cramping.  On repeat exam, is not feeling much better.  Will image to evaluate for possible diverticulitis or other infectious abdominal process.  Imaging personally reviewed, my informal interpretation as below: CT Imaging - I independently informally interpreted CT imaging and agree with Radiology Read as below:  CT abdomen pelvis -colitis, no evidence of abscess or diverticulitis  Additional IV fluids given.    Patient is still feeling quite orthostatic.  We discussed symptomatic treatment with ongoing fluid hydration orally but patient prefers to be admitted for further fluids and treatment.  Stool studies ordered and pending.   Medications  lactated ringers (LR) bolus 1,000 mL (has no administration in time range)  sodium chloride  0.9% (NS) bolus 1,000 mL (0 mLs Intravenous Stopped 01/09/22 1357)  ondansetron  (ZOFRAN ) injection 4 mg (4 mg Intravenous Given 01/09/22 1257)  famotidine (PF) (PEPCID) injection 20 mg (20 mg Intravenous Given 01/09/22 1258)  sodium chloride  0.9% (NS) bolus 1,000 mL (0 mLs Intravenous Stopped 01/09/22 1441)  dicyclomine (BENTYL) capsule 20 mg (20 mg Oral Given 01/09/22 1501)  iohexol  (OMNIPAQUE  350) 350 mg/mL injection 100 mL (100 mLs Intravenous Given 01/09/22 1519)    Nursing triage and assessment notes reviewed and incorporated. Patient has been screened for social determinants of health.  Medical screening exam: The patient has received a medical screening examination and  within reasonable clinical confidence an emergency medical condition has been identified and stabilized Radiology - images personally reviewed and interpreted as above Radiology - preliminary/final interpretation reviewed  Patient was placed on continuous telemetry monitoring.  Patient was given IV fluid hydration for:  Indication(s) for IV hydration in this patient is: Acute kidney injury   Procedures:  The following procedure was performed by me: []  EKG [x]  Tele monitoring      []  Other -    Assessment/Diagnoses:   1. Colitis Acute  2. Diarrhea, unspecified type Acute  3. Dehydration Acute  4. Orthostasis   5. AKI (acute kidney injury) (HCC)     Plan/Disposition:  Admit [facility] observation   Alfonso Potash, MD   Observation Procedure Note   Family history and medical history have been reviewed with the patient and is non-contributory except as noted otherwise.    Observation began at 12:30   It was necessary in order to determine  []  if their head injury required admission []  if their symptoms are consistent with ACS  []  if their allergic symptoms would return []  if their pain could be managed with oral medications []  if their asthma could be controlled with treatment in the emergency department []  if patient could be medically cleared  for MHE with MSW []  if the patient's symptoms were related to alcohol  or drug intoxication [x]  if they required admission  []  Other   The patient was re-evaluated at 13:00.  Vital signs have been reviewed.  The patient is stable.   The patient was re-evaluated at 14:30.  Vital signs have been reviewed.  The patient is still having abdominal pain.   The patient was re-evaluated at 16:00.  Vital signs have been reviewed.  The patient is receiving additional IVF.   Interventions during observation included: [x]  IV fluids [x]  Anti-emetics []  Analgesics []  Serial troponins []  Serial EKGs []  PO challenge []  Other [x]  Frequent  re-assessments   Upon final re-evaluation, observation revealed that the patient should be []  safely discharged, with outpatient follow up [x]  required admission or further observation in the hospital   []  are now medically cleared for MHE with MSW   Patient discharged from ED Obs during my shift at 17:15.    Total time of observation 4.5 hours.  Alfonso Potash MD   MIPS Social Drivers of Health [x]  Patient has been screened for social drivers of health.      []  Specific concerns include:      Alfonso GORMAN Potash, MD 01/09/22 [phone]

## 2022-01-17 DIAGNOSIS — F411 Generalized anxiety disorder: Secondary | ICD-10-CM | POA: Diagnosis not present

## 2022-01-18 DIAGNOSIS — G5602 Carpal tunnel syndrome, left upper limb: Secondary | ICD-10-CM | POA: Diagnosis not present

## 2022-01-18 DIAGNOSIS — M25522 Pain in left elbow: Secondary | ICD-10-CM | POA: Diagnosis not present

## 2022-01-19 DIAGNOSIS — F419 Anxiety disorder, unspecified: Secondary | ICD-10-CM | POA: Diagnosis not present

## 2022-01-19 DIAGNOSIS — Z3009 Encounter for other general counseling and advice on contraception: Secondary | ICD-10-CM | POA: Diagnosis not present

## 2022-01-31 DIAGNOSIS — F411 Generalized anxiety disorder: Secondary | ICD-10-CM | POA: Diagnosis not present

## 2022-02-14 DIAGNOSIS — Z302 Encounter for sterilization: Secondary | ICD-10-CM | POA: Diagnosis not present

## 2022-02-15 DIAGNOSIS — F4320 Adjustment disorder, unspecified: Secondary | ICD-10-CM | POA: Diagnosis not present

## 2022-03-08 DIAGNOSIS — F411 Generalized anxiety disorder: Secondary | ICD-10-CM | POA: Diagnosis not present

## 2022-03-16 DIAGNOSIS — F4322 Adjustment disorder with anxiety: Secondary | ICD-10-CM | POA: Diagnosis not present

## 2022-04-05 DIAGNOSIS — F419 Anxiety disorder, unspecified: Secondary | ICD-10-CM | POA: Diagnosis not present

## 2022-04-06 DIAGNOSIS — F4320 Adjustment disorder, unspecified: Secondary | ICD-10-CM | POA: Diagnosis not present

## 2022-04-17 DIAGNOSIS — H2513 Age-related nuclear cataract, bilateral: Secondary | ICD-10-CM | POA: Diagnosis not present

## 2022-04-20 DIAGNOSIS — F4322 Adjustment disorder with anxiety: Secondary | ICD-10-CM | POA: Diagnosis not present

## 2022-05-03 DIAGNOSIS — F4320 Adjustment disorder, unspecified: Secondary | ICD-10-CM | POA: Diagnosis not present

## 2022-05-04 DIAGNOSIS — F419 Anxiety disorder, unspecified: Secondary | ICD-10-CM | POA: Diagnosis not present

## 2022-05-18 DIAGNOSIS — F4322 Adjustment disorder with anxiety: Secondary | ICD-10-CM | POA: Diagnosis not present

## 2022-05-31 DIAGNOSIS — F4320 Adjustment disorder, unspecified: Secondary | ICD-10-CM | POA: Diagnosis not present

## 2022-06-01 DIAGNOSIS — F419 Anxiety disorder, unspecified: Secondary | ICD-10-CM | POA: Diagnosis not present

## 2022-06-22 DIAGNOSIS — F4322 Adjustment disorder with anxiety: Secondary | ICD-10-CM | POA: Diagnosis not present

## 2022-06-22 DIAGNOSIS — R7989 Other specified abnormal findings of blood chemistry: Secondary | ICD-10-CM | POA: Diagnosis not present

## 2022-06-22 DIAGNOSIS — K219 Gastro-esophageal reflux disease without esophagitis: Secondary | ICD-10-CM | POA: Diagnosis not present

## 2022-06-22 DIAGNOSIS — I700 Atherosclerosis of aorta: Secondary | ICD-10-CM | POA: Diagnosis not present

## 2022-06-22 DIAGNOSIS — Z125 Encounter for screening for malignant neoplasm of prostate: Secondary | ICD-10-CM | POA: Diagnosis not present

## 2022-06-22 DIAGNOSIS — E785 Hyperlipidemia, unspecified: Secondary | ICD-10-CM | POA: Diagnosis not present

## 2022-06-22 DIAGNOSIS — D509 Iron deficiency anemia, unspecified: Secondary | ICD-10-CM | POA: Diagnosis not present

## 2022-06-27 DIAGNOSIS — R82998 Other abnormal findings in urine: Secondary | ICD-10-CM | POA: Diagnosis not present

## 2022-06-27 DIAGNOSIS — J309 Allergic rhinitis, unspecified: Secondary | ICD-10-CM | POA: Diagnosis not present

## 2022-06-27 DIAGNOSIS — E785 Hyperlipidemia, unspecified: Secondary | ICD-10-CM | POA: Diagnosis not present

## 2022-06-27 DIAGNOSIS — Z0000 Encounter for general adult medical examination without abnormal findings: Secondary | ICD-10-CM | POA: Diagnosis not present

## 2022-06-27 DIAGNOSIS — F418 Other specified anxiety disorders: Secondary | ICD-10-CM | POA: Diagnosis not present

## 2022-06-27 DIAGNOSIS — Z23 Encounter for immunization: Secondary | ICD-10-CM | POA: Diagnosis not present

## 2022-06-27 DIAGNOSIS — I700 Atherosclerosis of aorta: Secondary | ICD-10-CM | POA: Diagnosis not present

## 2022-07-10 DIAGNOSIS — M25522 Pain in left elbow: Secondary | ICD-10-CM | POA: Diagnosis not present

## 2022-07-22 DIAGNOSIS — M25522 Pain in left elbow: Secondary | ICD-10-CM | POA: Diagnosis not present

## 2022-08-02 DIAGNOSIS — M25522 Pain in left elbow: Secondary | ICD-10-CM | POA: Diagnosis not present

## 2022-08-02 DIAGNOSIS — F4322 Adjustment disorder with anxiety: Secondary | ICD-10-CM | POA: Diagnosis not present

## 2022-08-31 DIAGNOSIS — M25522 Pain in left elbow: Secondary | ICD-10-CM | POA: Diagnosis not present

## 2022-08-31 DIAGNOSIS — F4322 Adjustment disorder with anxiety: Secondary | ICD-10-CM | POA: Diagnosis not present

## 2022-08-31 DIAGNOSIS — M542 Cervicalgia: Secondary | ICD-10-CM | POA: Diagnosis not present

## 2022-09-19 DIAGNOSIS — K4090 Unilateral inguinal hernia, without obstruction or gangrene, not specified as recurrent: Secondary | ICD-10-CM | POA: Diagnosis not present

## 2022-09-20 ENCOUNTER — Other Ambulatory Visit: Payer: Self-pay | Admitting: Family Medicine

## 2022-09-20 DIAGNOSIS — K4090 Unilateral inguinal hernia, without obstruction or gangrene, not specified as recurrent: Secondary | ICD-10-CM

## 2022-09-21 DIAGNOSIS — M542 Cervicalgia: Secondary | ICD-10-CM | POA: Diagnosis not present

## 2022-09-26 DIAGNOSIS — F4322 Adjustment disorder with anxiety: Secondary | ICD-10-CM | POA: Diagnosis not present

## 2022-09-26 DIAGNOSIS — L853 Xerosis cutis: Secondary | ICD-10-CM | POA: Diagnosis not present

## 2022-09-26 DIAGNOSIS — L821 Other seborrheic keratosis: Secondary | ICD-10-CM | POA: Diagnosis not present

## 2022-09-26 DIAGNOSIS — D1801 Hemangioma of skin and subcutaneous tissue: Secondary | ICD-10-CM | POA: Diagnosis not present

## 2022-09-26 DIAGNOSIS — L812 Freckles: Secondary | ICD-10-CM | POA: Diagnosis not present

## 2022-10-01 ENCOUNTER — Ambulatory Visit
Admission: RE | Admit: 2022-10-01 | Discharge: 2022-10-01 | Disposition: A | Discharge status: Home / Self Care | Payer: BC Managed Care – PPO | Source: Ambulatory Visit | Attending: Family Medicine | Admitting: Family Medicine

## 2022-10-01 DIAGNOSIS — K4090 Unilateral inguinal hernia, without obstruction or gangrene, not specified as recurrent: Secondary | ICD-10-CM | POA: Diagnosis not present

## 2022-10-01 MED ORDER — IOPAMIDOL (ISOVUE-300) INJECTION 61%
100.0000 mL | Freq: Once | INTRAVENOUS | Status: AC | PRN
  Administered 2022-10-01: 100 mL via INTRAVENOUS

## 2022-10-17 DIAGNOSIS — F4322 Adjustment disorder with anxiety: Secondary | ICD-10-CM | POA: Diagnosis not present

## 2022-10-22 DIAGNOSIS — M5412 Radiculopathy, cervical region: Secondary | ICD-10-CM | POA: Diagnosis not present

## 2022-11-16 DIAGNOSIS — K429 Umbilical hernia without obstruction or gangrene: Secondary | ICD-10-CM | POA: Diagnosis not present

## 2022-11-16 DIAGNOSIS — F4322 Adjustment disorder with anxiety: Secondary | ICD-10-CM | POA: Diagnosis not present

## 2022-11-16 DIAGNOSIS — K4020 Bilateral inguinal hernia, without obstruction or gangrene, not specified as recurrent: Secondary | ICD-10-CM | POA: Diagnosis not present

## 2022-12-14 DIAGNOSIS — F4322 Adjustment disorder with anxiety: Secondary | ICD-10-CM | POA: Diagnosis not present

## 2022-12-24 DIAGNOSIS — D649 Anemia, unspecified: Secondary | ICD-10-CM | POA: Diagnosis not present

## 2023-01-02 DIAGNOSIS — M25522 Pain in left elbow: Secondary | ICD-10-CM | POA: Diagnosis not present

## 2023-01-02 DIAGNOSIS — M542 Cervicalgia: Secondary | ICD-10-CM | POA: Diagnosis not present

## 2023-01-02 DIAGNOSIS — M6281 Muscle weakness (generalized): Secondary | ICD-10-CM | POA: Diagnosis not present

## 2023-01-08 DIAGNOSIS — M6281 Muscle weakness (generalized): Secondary | ICD-10-CM | POA: Diagnosis not present

## 2023-01-08 DIAGNOSIS — M542 Cervicalgia: Secondary | ICD-10-CM | POA: Diagnosis not present

## 2023-01-08 DIAGNOSIS — M25522 Pain in left elbow: Secondary | ICD-10-CM | POA: Diagnosis not present

## 2023-01-14 DIAGNOSIS — M6281 Muscle weakness (generalized): Secondary | ICD-10-CM | POA: Diagnosis not present

## 2023-01-14 DIAGNOSIS — M542 Cervicalgia: Secondary | ICD-10-CM | POA: Diagnosis not present

## 2023-01-14 DIAGNOSIS — M25522 Pain in left elbow: Secondary | ICD-10-CM | POA: Diagnosis not present

## 2023-01-15 DIAGNOSIS — K4020 Bilateral inguinal hernia, without obstruction or gangrene, not specified as recurrent: Secondary | ICD-10-CM | POA: Diagnosis not present

## 2023-01-15 DIAGNOSIS — K429 Umbilical hernia without obstruction or gangrene: Secondary | ICD-10-CM | POA: Diagnosis not present

## 2023-01-15 DIAGNOSIS — K4090 Unilateral inguinal hernia, without obstruction or gangrene, not specified as recurrent: Secondary | ICD-10-CM | POA: Diagnosis not present

## 2023-01-21 DIAGNOSIS — M542 Cervicalgia: Secondary | ICD-10-CM | POA: Diagnosis not present

## 2023-01-21 DIAGNOSIS — M25522 Pain in left elbow: Secondary | ICD-10-CM | POA: Diagnosis not present

## 2023-01-21 DIAGNOSIS — M6281 Muscle weakness (generalized): Secondary | ICD-10-CM | POA: Diagnosis not present

## 2023-01-28 DIAGNOSIS — M6281 Muscle weakness (generalized): Secondary | ICD-10-CM | POA: Diagnosis not present

## 2023-01-28 DIAGNOSIS — M25522 Pain in left elbow: Secondary | ICD-10-CM | POA: Diagnosis not present

## 2023-01-28 DIAGNOSIS — M542 Cervicalgia: Secondary | ICD-10-CM | POA: Diagnosis not present

## 2023-01-30 NOTE — Progress Notes (Signed)
   PROVIDER:  DEWARD PURCHASE STECHSCHULTE, MD  MRN: [id number] DOB: [birth year] DATE OF ENCOUNTER: 01/30/2023 Interval History:     Doing well postop.  Did some swing dancing and had some pain afterwards.    Physical Examination:   Physical Exam   Abd -incisions healing well   Assessment and Plan:     Dylan Stewart is a 57 y.o. male who underwent laparoscopic right inguinal hernia repair with mesh and primary closure of umbilical hernia defect on 01/15/23.  Diagnoses and all orders for this visit:  Postoperative state     Doing well postop.  No heavy lifting for 2 more weeks.  Follow-up if needed.    Return if symptoms worsen or fail to improve.   The plan was discussed in detail with the patient today, who expressed understanding.  The patient has my contact information, and understands to call me with any additional questions or concerns in the interval.  I would be happy to see the patient back sooner if the need arises.   PAUL Brandn STECHSCHULTE, MD

## 2023-02-01 DIAGNOSIS — F4322 Adjustment disorder with anxiety: Secondary | ICD-10-CM | POA: Diagnosis not present

## 2023-02-04 DIAGNOSIS — M6281 Muscle weakness (generalized): Secondary | ICD-10-CM | POA: Diagnosis not present

## 2023-02-04 DIAGNOSIS — M25522 Pain in left elbow: Secondary | ICD-10-CM | POA: Diagnosis not present

## 2023-02-04 DIAGNOSIS — M542 Cervicalgia: Secondary | ICD-10-CM | POA: Diagnosis not present

## 2023-02-26 DIAGNOSIS — M542 Cervicalgia: Secondary | ICD-10-CM | POA: Diagnosis not present

## 2023-02-26 DIAGNOSIS — M25522 Pain in left elbow: Secondary | ICD-10-CM | POA: Diagnosis not present

## 2023-02-26 DIAGNOSIS — M6281 Muscle weakness (generalized): Secondary | ICD-10-CM | POA: Diagnosis not present

## 2023-03-08 DIAGNOSIS — Z20822 Contact with and (suspected) exposure to covid-19: Secondary | ICD-10-CM | POA: Diagnosis not present

## 2023-03-08 DIAGNOSIS — J019 Acute sinusitis, unspecified: Secondary | ICD-10-CM | POA: Diagnosis not present

## 2023-03-25 DIAGNOSIS — M5136 Other intervertebral disc degeneration, lumbar region with discogenic back pain only: Secondary | ICD-10-CM | POA: Diagnosis not present

## 2023-03-25 DIAGNOSIS — M25552 Pain in left hip: Secondary | ICD-10-CM | POA: Diagnosis not present

## 2023-03-25 DIAGNOSIS — M9903 Segmental and somatic dysfunction of lumbar region: Secondary | ICD-10-CM | POA: Diagnosis not present

## 2023-03-25 DIAGNOSIS — M9905 Segmental and somatic dysfunction of pelvic region: Secondary | ICD-10-CM | POA: Diagnosis not present

## 2023-03-26 DIAGNOSIS — M9905 Segmental and somatic dysfunction of pelvic region: Secondary | ICD-10-CM | POA: Diagnosis not present

## 2023-03-26 DIAGNOSIS — M25552 Pain in left hip: Secondary | ICD-10-CM | POA: Diagnosis not present

## 2023-03-26 DIAGNOSIS — M9903 Segmental and somatic dysfunction of lumbar region: Secondary | ICD-10-CM | POA: Diagnosis not present

## 2023-03-26 DIAGNOSIS — M5136 Other intervertebral disc degeneration, lumbar region with discogenic back pain only: Secondary | ICD-10-CM | POA: Diagnosis not present

## 2023-04-01 DIAGNOSIS — M5412 Radiculopathy, cervical region: Secondary | ICD-10-CM | POA: Diagnosis not present

## 2023-04-02 DIAGNOSIS — M9903 Segmental and somatic dysfunction of lumbar region: Secondary | ICD-10-CM | POA: Diagnosis not present

## 2023-04-02 DIAGNOSIS — M9905 Segmental and somatic dysfunction of pelvic region: Secondary | ICD-10-CM | POA: Diagnosis not present

## 2023-04-02 DIAGNOSIS — M25552 Pain in left hip: Secondary | ICD-10-CM | POA: Diagnosis not present

## 2023-04-04 DIAGNOSIS — M9903 Segmental and somatic dysfunction of lumbar region: Secondary | ICD-10-CM | POA: Diagnosis not present

## 2023-04-04 DIAGNOSIS — M25552 Pain in left hip: Secondary | ICD-10-CM | POA: Diagnosis not present

## 2023-04-04 DIAGNOSIS — M9905 Segmental and somatic dysfunction of pelvic region: Secondary | ICD-10-CM | POA: Diagnosis not present

## 2023-04-10 NOTE — ED Provider Notes (Signed)
 EMERGENCY DEPARTMENT NOTE  04/10/2023 12:16 PM Provider:  Ozell Settle, MD     CHIEF COMPLAINT Chief Complaint  Patient presents with  . Abdominal Pain  . Diarrhea     TRIAGE NOTE ED Triage Notes by Donald Pon, RN at 04/10/2023 10:31 AM  Version 1 of 42      58 year old male ambulates in with c/o several episodes of diarrhea and generalized abdominal pain x 2 days. Pt believes he ate something bad and may have food poisoning. Denies vomiting, fevers, sob, cp         HISTORY OF PRESENT ILLNESS This patient is a 58 y.o. male with nausea, vomiting, diarrhea, abdominal pain.  Patient says he ate raw chicken 2 days ago.  Since then he has been having nausea, vomiting, and no diarrhea.  Abdominal cramping is diffuse, lower, nonfocal.  No blood.  No fevers, chills.     PAST MEDICAL HISTORY History reviewed. No pertinent past medical history. Active Ambulatory Problems    Diagnosis Date Noted  . No Active Ambulatory Problems   Resolved Ambulatory Problems    Diagnosis Date Noted  . No Resolved Ambulatory Problems   No Additional Past Medical History      PAST SURGICAL HISTORY History reviewed. No pertinent surgical history.    SOCIAL HISTORY Social History   Tobacco Use  . Smoking status: Never  . Smokeless tobacco: Never      MEDICATIONS Discharge Medication List as of 04/10/2023  2:38 PM     START taking these medications   Details  dicyclomine (Bentyl) 10 mg capsule Take 2 capsules (20 mg) by mouth 4 times a day as needed (abdominal pain) for up to 7 days., Starting Wed 04/10/2023, Until Wed 04/17/2023 at 2359, Normal    loperamide (Imodium A-D) 2 mg tablet Take 2 tablets (4 mg) by mouth 3 times a day as needed for diarrhea for up to 4 days., Starting Wed 04/10/2023, Until Sun 04/14/2023 at 2359, Normal    naproxen (Naprosyn) 500 mg tablet Take 1 tablet (500 mg) by mouth 2 times a day with meals for 10 days. PRN pain, Starting Wed 04/10/2023, Until Sat 04/20/2023,  Normal    ondansetron  ODT (Zofran -ODT) 4 mg disintegrating tablet Take 1 tablet (4 mg) by mouth every 6 hours if needed for nausea or vomiting for up to 5 days., Starting Wed 04/10/2023, Until Mon 04/15/2023 at 2359, Normal          ALLERGIES No Known Allergies    REVIEW OF SYSTEMS See the above HPI.  All other systems are reviewed and are negative at this time.    CONTINUOUS CARDIAC MONITORING  An order was placed for continuous cardiac monitoring.  The monitor shows: Sinus Tachycardia     PHYSICAL EXAM Triage vitals were reviewed.  ED Triage Vitals [04/10/23 1033]  Temp Heart Rate Resp BP  36.3 C (97.3 F) 105 18 110/64    SpO2 Temp Source Heart Rate Source Patient Position  98 % Oral Monitor Sitting    BP Location FiO2 (%)    Left arm --     Pulse oximetry interpreted by Ozell Settle, MD: Normal GENERAL:  Patient appears well developed, well nourished. HEENT:  Normocephalic and atraumatic. EOMI. No scleral icterus.  Airway is patent NECK:  Supple and no thyromegaly.  No carotid bruit appreciated.  Trachea is midline. RESP: Lungs are clear to auscultation bilaterally. No wheezing, rhonchi, rales CARDIOVASCULAR: Tachycardic but regular rhythm, no murmurs. GASTROINTESTINAL:  Abdomen  is soft, tender, across lower abdomen.  No rebound, no guarding. No pulsatile mass appreciated. BACK: No CVA or spinal midline tenderness to palpation MUSCULOSKELETAL:  No clubbing, cyanosis, or edema.  No calf tenderness to palpation. SKIN:  Warm and dry. NEUROLOGIC:  No focal deficits. No facial droop. Sensation intact. B/L patellar reflexes present. PSYCHIATRIC:  Alert and appropriate.     LABORATORY STUDIES Any labs ordered and resulted by the time of disposition or turnover were reviewed by me. Labs Reviewed  CBC WITH DIFFERENTIAL - Abnormal      Result Value   Auto WBC 11.3 (*)    Hemoglobin 15.9     Hematocrit 46.6     Platelets 241     MCV 92     RBC 5.08     MCH 31     MCHC  34.1     RDW 13.7     MPV 7.9     Neutrophils % 79 (*)    Lymphocytes % 11 (*)    Monocytes % 9     Eosinophils % 0 (*)    Basophils % 0     Absolute Neutrophil Count 8.9 (*)    Lymphocytes Absolute 1.2     Monocytes Absolute 1.0     Eosinophils Absolute 0.0     Basophils Absolute 0.0    COMPREHENSIVE METABOLIC PANEL - Abnormal   Glucose 147 (*)    Sodium 136     Potassium 3.5     Chloride 99     CO2 28     Anion Gap 9     BUN 13     Creatinine 1.2     Calcium  8.6     Alkaline Phosphatase 51     ALT 11     AST 12 (*)    Total Bilirubin 0.6     Total Protein 6.7     Albumin 4.1     EGFR 70    URINALYSIS COMPLETE - Abnormal   Color, Urine Yellow     Clarity, Urine Clear     Specific Gravity, Urine 1.006     pH, Urine 6.5     Protein, Urine Negative     Glucose, Urine Negative     Ketones, Urine Negative     Bilirubin, Urine Negative     Urobilinogen, Urine Normal     Blood, Urine 1+ (*)    Nitrite, Urine Negative     Leukocytes, Urine Negative     WBC, Urine 0-5     RBC, Urine 0-3     Bacteria, Urine None     Squamous Epithelial, Urine Few    INFLUENZA A/B/COVID19 NUCLEIC ACID AMPLIFICATION TEST - Normal   Influenza A NAAT Not Detected     Influenza B NAAT Not Detected     COVID-19 NAAT Not Detected    LIPASE - Normal   Lipase 12    RAINBOW DRAW ED PANEL      RADIOLOGY STUDIES Any radiology studies ordered and resulted by the time of disposition or turnover were reviewed by me. CT Abdomen Pelvis W IV Contrast  Final Result  IMPRESSION:    1.   No acute findings.    2.   Prior right lower quadrant surgery compatible with a prior hernia repair.    Finalized 04/10/2023 1:07 PM by Wolm LITTIE Sables        MEDICATIONS ADMINISTERED DURING THIS ED VISIT:  Medications  acetaminophen  (Tylenol ) tablet 1,000 mg (1,000  mg oral Given 04/10/23 1232)  ondansetron  (Zofran ) injection 4 mg (4 mg intravenous Given 04/10/23 1237)  sodium chloride  0.9 % bolus 1,000 mL (0  mL intravenous Stopped 04/10/23 1257)  iopamidol  (Isovue -370) 76 % injection 90 mL (90 mL intravenous Given 04/10/23 1258)        ED COURSE AND MEDICAL DECISION MAKING This is a 58 y.o. male presents with abdominal pain My Differential Diagnosis includes but is not limited to Biliary disease, Gastritis, PUD, Gastroenteritis, Gastroparesis, IBS, IBD, Colitis, Appendicitis, Diverticulitis, UTI, Pyleonephritis, Renal Stone, Constipation, Functional Pain. My plan for the patient includes Tylenol , Zofran , fluids for dehydration, labs, CT scan of the abdomen and pelvis, observation, reassess  My independent interpretation: CBC: Very slight leukocytosis, no anemia, no thrombocytopenia BMP: no renal impairment, no significant electrolyte abnormality, slight hyperglycemia Liver Panel: no e/o of liver pathology, normal biliary labs Lipase: wnl Influenza: Negative COVID-19: Negative UA: no blood, non-infectious  CT scan of the abdomen pelvis is unremarkable.  Patient appears well.  After IV fluids, he urinated, felt much better.  Possible viral gastroenteritis, possible foodborne illness. Has not made a stool sample here in the emergency department, he can provide a stool sample to PCP.  Family/friends were an independent external source of information, which added to the history present illness.  I reviewed the patient's current medication and decided to prescribe: Zofran , Bentyl, Naprosyn  I have reexamined the patient is resting comfortably in the patient's room.  I have updated them on results and plan.   PRESCRIPTION MANAGMENT WAS PERFORMED.   The patient presented to the emergency department with potentially life or limb-threatening symptoms.  After my REVIEW OF EXTERNAL RECORDS, history of present illness, examination, and workup have decided that the patient is stable.   I have reviewed ED return precautions with the patient. They understand to return if they are concerned for any  reason. .  They will follow-up with their PCP in 1-2 days.  If they are unable to follow-up with her PCP, they will return here to the emergency department for reevaluation  Procedures    CLINICAL IMPRESSION 1. Non-intractable vomiting with nausea       DISPOSITION Discharge    DISCHARGE MEDICATIONS Discharge Medication List as of 04/10/2023  2:38 PM     START taking these medications   Details  dicyclomine (Bentyl) 10 mg capsule Take 2 capsules (20 mg) by mouth 4 times a day as needed (abdominal pain) for up to 7 days., Starting Wed 04/10/2023, Until Wed 04/17/2023 at 2359, Normal    loperamide (Imodium A-D) 2 mg tablet Take 2 tablets (4 mg) by mouth 3 times a day as needed for diarrhea for up to 4 days., Starting Wed 04/10/2023, Until Sun 04/14/2023 at 2359, Normal    naproxen (Naprosyn) 500 mg tablet Take 1 tablet (500 mg) by mouth 2 times a day with meals for 10 days. PRN pain, Starting Wed 04/10/2023, Until Sat 04/20/2023, Normal    ondansetron  ODT (Zofran -ODT) 4 mg disintegrating tablet Take 1 tablet (4 mg) by mouth every 6 hours if needed for nausea or vomiting for up to 5 days., Starting Wed 04/10/2023, Until Mon 04/15/2023 at 2359, Normal           FOLLOW UP No Hosp Pcp Assigned  Schedule an appointment as soon as possible for a visit in 2 days Return to ED sooner if symptoms worsen  [facility] [address] [place] [address] [phone]  Schedule  an appointment as soon as possible for a visit  As needed, Return to ED sooner if symptoms worsen    Ozell Settle, MD  Please note: Unless specifically stated, all procedures mentioned, tests done and medications given were performed/interpreted by the emergency physician, or were under the direct supervision of the emergency physician. This ED Note was completed using voice-recognition transcription and may contain inadvertent errors.     Ozell Settle, MD 04/10/23 [phone]

## 2023-04-15 DIAGNOSIS — M9903 Segmental and somatic dysfunction of lumbar region: Secondary | ICD-10-CM | POA: Diagnosis not present

## 2023-04-15 DIAGNOSIS — M25552 Pain in left hip: Secondary | ICD-10-CM | POA: Diagnosis not present

## 2023-04-15 DIAGNOSIS — M5136 Other intervertebral disc degeneration, lumbar region with discogenic back pain only: Secondary | ICD-10-CM | POA: Diagnosis not present

## 2023-04-15 DIAGNOSIS — M9905 Segmental and somatic dysfunction of pelvic region: Secondary | ICD-10-CM | POA: Diagnosis not present

## 2023-04-18 DIAGNOSIS — H2513 Age-related nuclear cataract, bilateral: Secondary | ICD-10-CM | POA: Diagnosis not present

## 2023-04-22 DIAGNOSIS — M9903 Segmental and somatic dysfunction of lumbar region: Secondary | ICD-10-CM | POA: Diagnosis not present

## 2023-04-22 DIAGNOSIS — M25552 Pain in left hip: Secondary | ICD-10-CM | POA: Diagnosis not present

## 2023-04-22 DIAGNOSIS — M9905 Segmental and somatic dysfunction of pelvic region: Secondary | ICD-10-CM | POA: Diagnosis not present

## 2023-04-22 DIAGNOSIS — M5136 Other intervertebral disc degeneration, lumbar region with discogenic back pain only: Secondary | ICD-10-CM | POA: Diagnosis not present

## 2023-04-29 DIAGNOSIS — M9903 Segmental and somatic dysfunction of lumbar region: Secondary | ICD-10-CM | POA: Diagnosis not present

## 2023-04-29 DIAGNOSIS — M9905 Segmental and somatic dysfunction of pelvic region: Secondary | ICD-10-CM | POA: Diagnosis not present

## 2023-04-29 DIAGNOSIS — M25552 Pain in left hip: Secondary | ICD-10-CM | POA: Diagnosis not present

## 2023-05-06 DIAGNOSIS — M25552 Pain in left hip: Secondary | ICD-10-CM | POA: Diagnosis not present

## 2023-05-06 DIAGNOSIS — M9903 Segmental and somatic dysfunction of lumbar region: Secondary | ICD-10-CM | POA: Diagnosis not present

## 2023-05-06 DIAGNOSIS — M9905 Segmental and somatic dysfunction of pelvic region: Secondary | ICD-10-CM | POA: Diagnosis not present

## 2023-05-09 DIAGNOSIS — M9905 Segmental and somatic dysfunction of pelvic region: Secondary | ICD-10-CM | POA: Diagnosis not present

## 2023-05-09 DIAGNOSIS — M9903 Segmental and somatic dysfunction of lumbar region: Secondary | ICD-10-CM | POA: Diagnosis not present

## 2023-05-09 DIAGNOSIS — M25552 Pain in left hip: Secondary | ICD-10-CM | POA: Diagnosis not present

## 2023-05-20 DIAGNOSIS — M9903 Segmental and somatic dysfunction of lumbar region: Secondary | ICD-10-CM | POA: Diagnosis not present

## 2023-05-20 DIAGNOSIS — M25552 Pain in left hip: Secondary | ICD-10-CM | POA: Diagnosis not present

## 2023-05-20 DIAGNOSIS — M5136 Other intervertebral disc degeneration, lumbar region with discogenic back pain only: Secondary | ICD-10-CM | POA: Diagnosis not present

## 2023-05-20 DIAGNOSIS — M9905 Segmental and somatic dysfunction of pelvic region: Secondary | ICD-10-CM | POA: Diagnosis not present

## 2023-05-27 DIAGNOSIS — M5136 Other intervertebral disc degeneration, lumbar region with discogenic back pain only: Secondary | ICD-10-CM | POA: Diagnosis not present

## 2023-05-27 DIAGNOSIS — M9905 Segmental and somatic dysfunction of pelvic region: Secondary | ICD-10-CM | POA: Diagnosis not present

## 2023-05-27 DIAGNOSIS — M25552 Pain in left hip: Secondary | ICD-10-CM | POA: Diagnosis not present

## 2023-05-27 DIAGNOSIS — M9903 Segmental and somatic dysfunction of lumbar region: Secondary | ICD-10-CM | POA: Diagnosis not present

## 2023-06-05 DIAGNOSIS — M25552 Pain in left hip: Secondary | ICD-10-CM | POA: Diagnosis not present

## 2023-06-05 DIAGNOSIS — M9903 Segmental and somatic dysfunction of lumbar region: Secondary | ICD-10-CM | POA: Diagnosis not present

## 2023-06-05 DIAGNOSIS — M9905 Segmental and somatic dysfunction of pelvic region: Secondary | ICD-10-CM | POA: Diagnosis not present

## 2023-06-10 DIAGNOSIS — M9903 Segmental and somatic dysfunction of lumbar region: Secondary | ICD-10-CM | POA: Diagnosis not present

## 2023-06-10 DIAGNOSIS — M25552 Pain in left hip: Secondary | ICD-10-CM | POA: Diagnosis not present

## 2023-06-10 DIAGNOSIS — M9905 Segmental and somatic dysfunction of pelvic region: Secondary | ICD-10-CM | POA: Diagnosis not present

## 2023-06-11 DIAGNOSIS — K625 Hemorrhage of anus and rectum: Secondary | ICD-10-CM | POA: Diagnosis not present

## 2023-06-11 DIAGNOSIS — K921 Melena: Secondary | ICD-10-CM | POA: Diagnosis not present

## 2023-06-12 ENCOUNTER — Telehealth: Payer: Self-pay | Admitting: Gastroenterology

## 2023-06-12 NOTE — Telephone Encounter (Signed)
 Patient called stated he needed to go to an urgent care facility yesterday for internal rectal bleeding. They advised him to call our office for further advise. He also stated he will be going out of town tomorrow and would need to be evaluated before he leaves.

## 2023-06-12 NOTE — Telephone Encounter (Signed)
 Pt states he was seen at an urgent card on [address] yesterday for BRBPR. Colon done 2 years ago mentions internal hems. Pt states he was told by urgent care that he should be seen as he is passing a significant amount of BRB. He was given suppositories. Pt states he would need to be seen this afternoon or early am tomorrow as [place] is going [place]. Discussed with pt that we do not have any appts available but note will be sent to Dr. Lanetta Inch PA fpr any further recommendations. He is not having pain and is not passing blood without having BM. Please advise.

## 2023-06-12 NOTE — Telephone Encounter (Signed)
 Spoke with pt and he is aware of recommendations per [facility]. Pt had labs yesterday and states they were fine. He also reports he was prescribed the suppositories. He knows er precautions and will call back if still having issues.

## 2023-07-01 DIAGNOSIS — M9903 Segmental and somatic dysfunction of lumbar region: Secondary | ICD-10-CM | POA: Diagnosis not present

## 2023-07-01 DIAGNOSIS — M9905 Segmental and somatic dysfunction of pelvic region: Secondary | ICD-10-CM | POA: Diagnosis not present

## 2023-07-01 DIAGNOSIS — M25552 Pain in left hip: Secondary | ICD-10-CM | POA: Diagnosis not present

## 2023-07-01 DIAGNOSIS — M5136 Other intervertebral disc degeneration, lumbar region with discogenic back pain only: Secondary | ICD-10-CM | POA: Diagnosis not present

## 2023-07-08 DIAGNOSIS — M9903 Segmental and somatic dysfunction of lumbar region: Secondary | ICD-10-CM | POA: Diagnosis not present

## 2023-07-08 DIAGNOSIS — M5136 Other intervertebral disc degeneration, lumbar region with discogenic back pain only: Secondary | ICD-10-CM | POA: Diagnosis not present

## 2023-07-08 DIAGNOSIS — M9905 Segmental and somatic dysfunction of pelvic region: Secondary | ICD-10-CM | POA: Diagnosis not present

## 2023-07-08 DIAGNOSIS — M25552 Pain in left hip: Secondary | ICD-10-CM | POA: Diagnosis not present

## 2023-07-17 DIAGNOSIS — M25552 Pain in left hip: Secondary | ICD-10-CM | POA: Diagnosis not present

## 2023-07-17 DIAGNOSIS — M9905 Segmental and somatic dysfunction of pelvic region: Secondary | ICD-10-CM | POA: Diagnosis not present

## 2023-07-17 DIAGNOSIS — M5136 Other intervertebral disc degeneration, lumbar region with discogenic back pain only: Secondary | ICD-10-CM | POA: Diagnosis not present

## 2023-07-17 DIAGNOSIS — M9903 Segmental and somatic dysfunction of lumbar region: Secondary | ICD-10-CM | POA: Diagnosis not present

## 2023-07-22 DIAGNOSIS — M9905 Segmental and somatic dysfunction of pelvic region: Secondary | ICD-10-CM | POA: Diagnosis not present

## 2023-07-22 DIAGNOSIS — M25552 Pain in left hip: Secondary | ICD-10-CM | POA: Diagnosis not present

## 2023-07-22 DIAGNOSIS — M5136 Other intervertebral disc degeneration, lumbar region with discogenic back pain only: Secondary | ICD-10-CM | POA: Diagnosis not present

## 2023-07-22 DIAGNOSIS — M9903 Segmental and somatic dysfunction of lumbar region: Secondary | ICD-10-CM | POA: Diagnosis not present

## 2023-08-30 DIAGNOSIS — F4322 Adjustment disorder with anxiety: Secondary | ICD-10-CM | POA: Diagnosis not present

## 2023-09-20 DIAGNOSIS — F4322 Adjustment disorder with anxiety: Secondary | ICD-10-CM | POA: Diagnosis not present

## 2023-10-05 ENCOUNTER — Emergency Department (HOSPITAL_COMMUNITY)

## 2023-10-05 ENCOUNTER — Emergency Department (HOSPITAL_COMMUNITY): Admission: EM | Admit: 2023-10-05 | Discharge: 2023-10-05 | Disposition: A | Discharge status: Home / Self Care

## 2023-10-05 ENCOUNTER — Encounter (HOSPITAL_COMMUNITY): Payer: Self-pay | Admitting: Emergency Medicine

## 2023-10-05 ENCOUNTER — Other Ambulatory Visit: Payer: Self-pay

## 2023-10-05 DIAGNOSIS — R109 Unspecified abdominal pain: Secondary | ICD-10-CM | POA: Insufficient documentation

## 2023-10-05 DIAGNOSIS — J45909 Unspecified asthma, uncomplicated: Secondary | ICD-10-CM | POA: Insufficient documentation

## 2023-10-05 DIAGNOSIS — R1031 Right lower quadrant pain: Secondary | ICD-10-CM | POA: Diagnosis not present

## 2023-10-05 LAB — CBC WITH DIFFERENTIAL/PLATELET
Abs Immature Granulocytes: 0.07 K/uL (ref 0.00–0.07)
Basophils Absolute: 0.1 K/uL (ref 0.0–0.1)
Basophils Relative: 1 %
Eosinophils Absolute: 0.5 K/uL (ref 0.0–0.5)
Eosinophils Relative: 5 %
HCT: 50.3 % (ref 39.0–52.0)
Hemoglobin: 16.8 g/dL (ref 13.0–17.0)
Immature Granulocytes: 1 %
Lymphocytes Relative: 36 %
Lymphs Abs: 3.2 K/uL (ref 0.7–4.0)
MCH: 31.5 pg (ref 26.0–34.0)
MCHC: 33.4 g/dL (ref 30.0–36.0)
MCV: 94.2 fL (ref 80.0–100.0)
Monocytes Absolute: 0.7 K/uL (ref 0.1–1.0)
Monocytes Relative: 7 %
Neutro Abs: 4.4 K/uL (ref 1.7–7.7)
Neutrophils Relative %: 50 %
Platelets: 300 K/uL (ref 150–400)
RBC: 5.34 MIL/uL (ref 4.22–5.81)
RDW: 12.9 % (ref 11.5–15.5)
WBC: 8.8 K/uL (ref 4.0–10.5)
nRBC: 0 % (ref 0.0–0.2)

## 2023-10-05 LAB — COMPREHENSIVE METABOLIC PANEL WITH GFR
ALT: 17 U/L (ref 0–44)
AST: 19 U/L (ref 15–41)
Albumin: 4.2 g/dL (ref 3.5–5.0)
Alkaline Phosphatase: 64 U/L (ref 38–126)
Anion gap: 9 (ref 5–15)
BUN: 13 mg/dL (ref 6–20)
CO2: 29 mmol/L (ref 22–32)
Calcium: 9.6 mg/dL (ref 8.9–10.3)
Chloride: 100 mmol/L (ref 98–111)
Creatinine, Ser: 1.03 mg/dL (ref 0.61–1.24)
GFR, Estimated: >60 mL/min (ref >60)
Glucose, Bld: 123 mg/dL — ABNORMAL HIGH (ref 70–99)
Potassium: 3.5 mmol/L (ref 3.5–5.1)
Sodium: 138 mmol/L (ref 135–145)
Total Bilirubin: 0.8 mg/dL (ref 0.0–1.2)
Total Protein: 7.4 g/dL (ref 6.5–8.1)

## 2023-10-05 LAB — URINALYSIS, ROUTINE W REFLEX MICROSCOPIC
Bilirubin Urine: NEGATIVE
Glucose, UA: NEGATIVE mg/dL
Hgb urine dipstick: NEGATIVE
Ketones, ur: NEGATIVE mg/dL
Leukocytes,Ua: NEGATIVE
Nitrite: NEGATIVE
Protein, ur: NEGATIVE mg/dL
Specific Gravity, Urine: 1.016 (ref 1.005–1.030)
pH: 7 (ref 5.0–8.0)

## 2023-10-05 MED ORDER — METHOCARBAMOL 500 MG PO TABS
1000.0000 mg | ORAL_TABLET | Freq: Once | ORAL | Status: AC
  Administered 2023-10-05: 1000 mg via ORAL
  Filled 2023-10-05: qty 2

## 2023-10-05 MED ORDER — LIDOCAINE 5 % EX PTCH: 1.0000 | MEDICATED_PATCH | CUTANEOUS | 0 refills | Status: AC

## 2023-10-05 MED ORDER — SODIUM CHLORIDE 0.9 % IV BOLUS
1000.0000 mL | Freq: Once | INTRAVENOUS | Status: AC
  Administered 2023-10-05: 1000 mL via INTRAVENOUS

## 2023-10-05 MED ORDER — LIDOCAINE 5 % EX PTCH
1.0000 | MEDICATED_PATCH | CUTANEOUS | Status: DC
  Administered 2023-10-05: 1 via TRANSDERMAL
  Filled 2023-10-05: qty 1

## 2023-10-05 MED ORDER — MORPHINE SULFATE (PF) 2 MG/ML IV SOLN
2.0000 mg | Freq: Once | INTRAVENOUS | Status: AC
  Administered 2023-10-05: 2 mg via INTRAVENOUS
  Filled 2023-10-05: qty 1

## 2023-10-05 MED ORDER — ONDANSETRON HCL 4 MG/2ML IJ SOLN
4.0000 mg | Freq: Once | INTRAMUSCULAR | Status: AC
  Administered 2023-10-05: 4 mg via INTRAVENOUS
  Filled 2023-10-05: qty 2

## 2023-10-05 MED ORDER — METHOCARBAMOL 500 MG PO TABS: 500.0000 mg | ORAL_TABLET | Freq: Two times a day (BID) | ORAL | 0 refills | Status: AC

## 2023-10-05 MED ORDER — KETOROLAC TROMETHAMINE 15 MG/ML IJ SOLN
15.0000 mg | Freq: Once | INTRAMUSCULAR | Status: AC
  Administered 2023-10-05: 15 mg via INTRAVENOUS
  Filled 2023-10-05: qty 1

## 2023-10-05 NOTE — ED Triage Notes (Addendum)
  Patient comes in with R flank pain that has been going on since 1430 yesterday afternoon.  Denies any injury.  Endorses nausea with no vomiting.  States urine has been darker.  No hx of kidney stones.  Pain 8/10, sharp.  Took tylenol  around 1630 yesterday.

## 2023-10-05 NOTE — Discharge Instructions (Signed)
 As we discussed, your workup in the ER today was reassuring for acute findings.  Laboratory evaluation and CT imaging did not reveal any emergent cause of your symptoms.  Given you had some improvement with the muscle relaxer and the lidocaine  patch, I have given you a prescription for both of these to go home with.  Do not drive or operate heavy machinery while taking the Robaxin  as it can be sedating.  Please use acetaminophen  (Tylenol ) or ibuprofen (Advil, Motrin) for pain.  You may use 800 mg ibuprofen every 6 hours or 1000 mg of acetaminophen  every 6 hours.  You may choose to alternate between the two, this would be most effective. Do not exceed 4000 mg of acetaminophen  within 24 hours.  Do not exceed 3200 mg ibuprofen within 24 hours.  Return if development of any new or worsening symptoms

## 2023-10-05 NOTE — ED Provider Notes (Addendum)
 [place] EMERGENCY DEPARTMENT AT [facility] Provider Note   CSN: [id number] Arrival date & time: 10/05/23  9479     Patient presents with: Flank Pain   Dylan Stewart is a 58 y.o. male.   Patient with no pertinent past medical history presents today with complaints of flank pain.  He reports that same began yesterday afternoon while he was sitting in a chair and has been intermittent since then without discernable trigger. He reports that he did try to get up and walk around and felt like he got some improvement with this. Pain is in the right flank and radiates into the right lower quadrant of his abdomen.  Denies history of similar pain previously.  Does have history of appendectomy as well as hernia repair.  Reports nausea without vomiting or diarrhea. Does feel like his urine is darker than normal but denies any dysuria. No fevers or chills. No recent heavy lifting or overuse. No mid-back pain. Pain does not radiate down his legs. No loss of bowel or bladder function or saddle anesthesia. No numbness/tingling or weakness in his extremities.  The history is provided by the patient. No language interpreter was used.  Flank Pain       Prior to Admission medications   Medication Sig Start Date End Date Taking? Authorizing Provider  acetaminophen  (TYLENOL ) 500 MG tablet Take 1,000 mg by mouth every 6 (six) hours as needed for pain.     [provider]  beclomethasone (QVAR REDIHALER) 80 MCG/ACT inhaler 1 puff as needed. 11/04/17   [provider]  cetirizine (ZYRTEC) 10 MG tablet Take 10 mg by mouth as needed for allergies.    [provider]  DULoxetine  (CYMBALTA ) 30 MG capsule Take 30 mg by mouth daily.    [provider]  ezetimibe (ZETIA) 10 MG tablet ezetimibe 10 mg tablet    [provider]  Multiple Vitamin (MULTI-VITAMIN DAILY PO)  06/03/18   [provider]  omeprazole (PRILOSEC) 40 MG capsule Take 40 mg by  mouth daily. 04/29/19   [provider]  Triamcinolone Acetonide (NASACORT ALLERGY 24HR NA) as needed. 08/14/19   [provider]    Allergies: Morphine  and codeine and Statins    Review of Systems  Genitourinary:  Positive for flank pain.  All other systems reviewed and are negative.   Updated Vital Signs BP (!) 146/92 (BP Location: Left Arm)   Pulse 96   Temp 97.7 F (36.5 C) (Oral)   Resp 18   Ht 6' 1 (1.854 m)   Wt 97.5 kg   SpO2 96%   BMI 28.37 kg/m   Physical Exam Vitals and nursing note reviewed.  Constitutional:      General: He is not in acute distress.    Appearance: Normal appearance. He is normal weight. He is not ill-appearing, toxic-appearing or diaphoretic.  HENT:     Head: Normocephalic and atraumatic.  Cardiovascular:     Rate and Rhythm: Normal rate.     Pulses:          Dorsalis pedis pulses are 2+ on the right side and 2+ on the left side.       Posterior tibial pulses are 2+ on the right side and 2+ on the left side.  Pulmonary:     Effort: Pulmonary effort is normal. No respiratory distress.  Abdominal:     General: Abdomen is flat.     Palpations: Abdomen is soft.  Tenderness: There is no abdominal tenderness. There is no right CVA tenderness or left CVA tenderness.  Musculoskeletal:        General: Normal range of motion.     Cervical back: Normal range of motion.     Comments: No midline tenderness to palpation of the cervical, thoracic, or lumbar spine. No stepoffs, lesions, deformity, or overlying skin changes  When asked to point to patients area of pain, he points to the right back area just medial to the hip, unable to reproduce pain in this area. No pain with movement. No rashes or lesions.  Ambulatory with steady gait. 5/5 strength and sensation to bilateral lower extremities.   Skin:    General: Skin is warm and dry.     Findings: No rash.  Neurological:     General: No focal deficit present.     Mental Status:  He is alert.  Psychiatric:        Mood and Affect: Mood normal.        Behavior: Behavior normal.     (all labs ordered are listed, but only abnormal results are displayed) Labs Reviewed  COMPREHENSIVE METABOLIC PANEL WITH GFR - Abnormal; Notable for the following components:      Result Value   Glucose, Bld 123 (*)    All other components within normal limits  CBC WITH DIFFERENTIAL/PLATELET  URINALYSIS, ROUTINE W REFLEX MICROSCOPIC    EKG: None  Radiology: CT Renal Stone Study Result Date: 10/05/2023 CLINICAL DATA:  Right flank pain since yesterday.  Nausea. EXAM: CT ABDOMEN AND PELVIS WITHOUT CONTRAST TECHNIQUE: Multidetector CT imaging of the abdomen and pelvis was performed following the standard protocol without IV contrast. RADIATION DOSE REDUCTION: This exam was performed according to the departmental dose-optimization program which includes automated exposure control, adjustment of the mA and/or kV according to patient size and/or use of iterative reconstruction technique. COMPARISON:  10/01/2022 FINDINGS: Lower chest: Dependent atelectasis. Hepatobiliary: No suspicious focal abnormality in the liver on this study without intravenous contrast. There is no evidence for gallstones, gallbladder wall thickening, or pericholecystic fluid. No intrahepatic or extrahepatic biliary dilation. Pancreas: No focal mass lesion. No dilatation of the main duct. No intraparenchymal cyst. No peripancreatic edema. Spleen: No splenomegaly. No suspicious focal mass lesion. Adrenals/Urinary Tract: No adrenal nodule or mass. Kidneys unremarkable. No evidence for hydroureter. The urinary bladder appears normal for the degree of distention. Stomach/Bowel: 2.4 cm well-defined homogeneous low-density lesion is identified at the esophagogastric junction. This is been present over multiple prior studies dating back to an MRI of 12/29/07 when it measured 2 cm. 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 Stomach otherwise unremarkable. Duodenum is normally positioned as is the ligament of Treitz. No small bowel wall thickening. No small bowel dilatation. The terminal ileum is normal. Nonvisualization of the appendix is consistent with the reported history of appendectomy. No gross colonic mass. No colonic wall thickening. Vascular/Lymphatic: No abdominal aortic aneurysm. No abdominal aortic atherosclerotic calcification. There is no gastrohepatic or hepatoduodenal ligament lymphadenopathy. No retroperitoneal or mesenteric lymphadenopathy. No pelvic sidewall lymphadenopathy. Reproductive: The prostate gland and seminal vesicles are unremarkable. Other: No intraperitoneal free fluid. Musculoskeletal: Interval right groin hernia repair with mesh placement. No worrisome lytic or sclerotic osseous abnormality. IMPRESSION: 1. No acute findings in the abdomen or pelvis. Specifically, no no urinary stone disease. No findings to explain the patient's history of right flank pain. Electronically Signed   By: Camellia Candle  M.D.   On: 10/05/2023 08:33     Procedures   Medications Ordered in the ED  sodium chloride  0.9 % bolus 1,000 mL (has no administration in time range)  ketorolac  (TORADOL ) 15 MG/ML injection 15 mg (has no administration in time range)                                    Medical Decision Making Amount and/or Complexity of Data Reviewed Labs: ordered. Radiology: ordered.  Risk Prescription drug management.   This patient is a 58 y.o. male who presents to the ED for concern of right flank pain, this involves an extensive number of treatment options, and is a complaint that carries with it a high risk of complications and morbidity. The emergent differential diagnosis prior to evaluation includes, but is not limited to,  AAA, renal vascular thrombosis, mesenteric ischemia, pyelonephritis, nephrolithiasis, cystitis, biliary colic, appendicitis,  diverticulitis, bowel obstruction, Testicular torsion, Epididymitis  This is not an exhaustive differential.   Past Medical History / Co-morbidities / Social History:  has a past medical history of Adenomatous polyps, Allergy, Anemia, Anxiety, Asthma, GERD (gastroesophageal reflux disease), Hemorrhoids, and Hyperlipidemia.  Additional history: Chart reviewed.  Physical Exam: Physical exam performed. The pertinent findings include: well appearing, unable to reproduce patients pain. No skin changes. No weakness or numbness/tingling. No midline spinal tenderness  Lab Tests: I ordered, and personally interpreted labs.  The pertinent results include:  no acute laboratory abnormalities   Imaging Studies: I ordered imaging studies including CT renal. I independently visualized and interpreted imaging which showed   1. No acute findings in the abdomen or pelvis. Specifically, no urinary stone disease. No findings to explain the patient's history of right flank pain.  I agree with the radiologist interpretation.   Medications: I ordered medication including toradol , fluids, morphine , zofran , robaxin , lidocaine  patch  for pain, nausea. Reevaluation of the patient after these medicines showed that the patient improved. I have reviewed the patients home medicines and have made adjustments as needed.   Disposition: After consideration of the diagnostic results and the patients response to treatment, I feel that emergency department workup does not suggest an emergent condition requiring admission or immediate intervention beyond what has been performed at this time. The plan is: Discharge with close outpatient follow-up and return precautions.  Patient's workup is benign, after above interventions he is feeling better and ready to go home.  He did feel like the lidocaine  and the Robaxin  helped his symptoms.  Could potentially be muscular in etiology given absence of other findings.  Will send home with  prescription for both of these.  Advised not to drive or operate machinery while taking Robaxin  given sedating properties.  He has no signs or symptoms to suggest AAA, no concerns for cauda equina. Evaluation and diagnostic testing in the emergency department does not suggest an emergent condition requiring admission or immediate intervention beyond what has been performed at this time.  Plan for discharge with close PCP follow-up.  Patient is understanding and amenable with plan, educated on red flag symptoms that would prompt immediate return.  Patient discharged in stable condition.  Final diagnoses:  Right flank pain    ED Discharge Orders          Ordered    methocarbamol  (ROBAXIN ) 500 MG tablet  2 times daily        10/05/23 1233    lidocaine  (  LIDODERM ) 5 %  Every 24 hours        10/05/23 1233          An After Visit Summary was printed and given to the patient.      Nora Lauraine DELENA DEVONNA 10/05/23 1234    Nora Lauraine DELENA, PA-C 10/05/23 1415    Simon Lavonia SAILOR, MD 10/05/23 [phone]

## 2023-10-15 DIAGNOSIS — D225 Melanocytic nevi of trunk: Secondary | ICD-10-CM | POA: Diagnosis not present

## 2023-10-15 DIAGNOSIS — L812 Freckles: Secondary | ICD-10-CM | POA: Diagnosis not present

## 2023-10-15 DIAGNOSIS — L821 Other seborrheic keratosis: Secondary | ICD-10-CM | POA: Diagnosis not present

## 2023-10-15 DIAGNOSIS — D2272 Melanocytic nevi of left lower limb, including hip: Secondary | ICD-10-CM | POA: Diagnosis not present

## 2023-10-28 DIAGNOSIS — D509 Iron deficiency anemia, unspecified: Secondary | ICD-10-CM | POA: Diagnosis not present

## 2023-10-29 DIAGNOSIS — E785 Hyperlipidemia, unspecified: Secondary | ICD-10-CM | POA: Diagnosis not present

## 2023-10-29 DIAGNOSIS — Z125 Encounter for screening for malignant neoplasm of prostate: Secondary | ICD-10-CM | POA: Diagnosis not present

## 2023-11-04 DIAGNOSIS — I700 Atherosclerosis of aorta: Secondary | ICD-10-CM | POA: Diagnosis not present

## 2023-11-04 DIAGNOSIS — Z1389 Encounter for screening for other disorder: Secondary | ICD-10-CM | POA: Diagnosis not present

## 2023-11-04 DIAGNOSIS — Z0000 Encounter for general adult medical examination without abnormal findings: Secondary | ICD-10-CM | POA: Diagnosis not present

## 2023-11-04 DIAGNOSIS — R82998 Other abnormal findings in urine: Secondary | ICD-10-CM | POA: Diagnosis not present

## 2023-11-04 DIAGNOSIS — Z79899 Other long term (current) drug therapy: Secondary | ICD-10-CM | POA: Diagnosis not present

## 2023-11-04 DIAGNOSIS — Z1331 Encounter for screening for depression: Secondary | ICD-10-CM | POA: Diagnosis not present

## 2023-11-13 DIAGNOSIS — E538 Deficiency of other specified B group vitamins: Secondary | ICD-10-CM | POA: Diagnosis not present

## 2023-11-20 DIAGNOSIS — F4322 Adjustment disorder with anxiety: Secondary | ICD-10-CM | POA: Diagnosis not present

## 2023-12-18 DIAGNOSIS — F4322 Adjustment disorder with anxiety: Secondary | ICD-10-CM | POA: Diagnosis not present

## 2023-12-18 DIAGNOSIS — R059 Cough, unspecified: Secondary | ICD-10-CM | POA: Diagnosis not present

## 2023-12-18 DIAGNOSIS — J069 Acute upper respiratory infection, unspecified: Secondary | ICD-10-CM | POA: Diagnosis not present

## 2023-12-18 DIAGNOSIS — Z20822 Contact with and (suspected) exposure to covid-19: Secondary | ICD-10-CM | POA: Diagnosis not present

## 2023-12-20 DIAGNOSIS — D509 Iron deficiency anemia, unspecified: Secondary | ICD-10-CM | POA: Diagnosis not present

## 2024-01-17 DIAGNOSIS — F4322 Adjustment disorder with anxiety: Secondary | ICD-10-CM | POA: Diagnosis not present

## 2024-02-11 DIAGNOSIS — E538 Deficiency of other specified B group vitamins: Secondary | ICD-10-CM | POA: Diagnosis not present

## 2024-02-11 DIAGNOSIS — Z1389 Encounter for screening for other disorder: Secondary | ICD-10-CM | POA: Diagnosis not present

## 2024-02-12 DIAGNOSIS — F3341 Major depressive disorder, recurrent, in partial remission: Secondary | ICD-10-CM | POA: Diagnosis not present

## 2024-02-12 DIAGNOSIS — F4321 Adjustment disorder with depressed mood: Secondary | ICD-10-CM | POA: Diagnosis not present

## 2024-03-11 DIAGNOSIS — F4322 Adjustment disorder with anxiety: Secondary | ICD-10-CM | POA: Diagnosis not present
